# Patient Record
Sex: Female | Born: 1950
Health system: Southern US, Community
[De-identification: ages and names within clinical notes are randomized; demographics above are authoritative.]

## PROBLEM LIST (undated history)

## (undated) DIAGNOSIS — E7800 Pure hypercholesterolemia, unspecified: Secondary | ICD-10-CM

## (undated) DIAGNOSIS — I10 Essential (primary) hypertension: Secondary | ICD-10-CM

## (undated) DIAGNOSIS — K219 Gastro-esophageal reflux disease without esophagitis: Secondary | ICD-10-CM

## (undated) DIAGNOSIS — E119 Type 2 diabetes mellitus without complications: Secondary | ICD-10-CM

## (undated) HISTORY — PX: EYE SURGERY: SHX253

## (undated) HISTORY — PX: CATARACT EXTRACTION W/ INTRAOCULAR LENS  IMPLANT, BILATERAL: SHX1307

## (undated) HISTORY — PX: BACK SURGERY: SHX140

## (undated) HISTORY — PX: ORIF PATELLA: SHX5033

## (undated) HISTORY — PX: PHOTOCOAGULATION WITH LASER: SHX6027

## (undated) HISTORY — PX: CHOLECYSTECTOMY: SHX55

---

## 1973-11-09 HISTORY — PX: TUBAL LIGATION: SHX77

## 1979-07-11 HISTORY — PX: TRIGGER FINGER RELEASE: SHX641

## 1979-07-11 HISTORY — PX: CYST EXCISION: SHX5701

## 1987-11-10 HISTORY — PX: LUMBAR DISC SURGERY: SHX700

## 1998-07-24 ENCOUNTER — Other Ambulatory Visit: Admission: RE | Admit: 1998-07-24 | Discharge: 1998-07-24 | Payer: Self-pay | Admitting: *Deleted

## 1999-07-24 ENCOUNTER — Other Ambulatory Visit: Admission: RE | Admit: 1999-07-24 | Discharge: 1999-07-24 | Payer: Self-pay | Admitting: *Deleted

## 1999-11-10 HISTORY — PX: CARDIAC CATHETERIZATION: SHX172

## 2000-07-27 ENCOUNTER — Other Ambulatory Visit: Admission: RE | Admit: 2000-07-27 | Discharge: 2000-07-27 | Payer: Self-pay | Admitting: *Deleted

## 2001-02-10 ENCOUNTER — Encounter: Admission: RE | Admit: 2001-02-10 | Discharge: 2001-05-11 | Payer: Self-pay | Admitting: Internal Medicine

## 2001-08-10 ENCOUNTER — Other Ambulatory Visit: Admission: RE | Admit: 2001-08-10 | Discharge: 2001-08-10 | Payer: Self-pay | Admitting: *Deleted

## 2002-03-10 ENCOUNTER — Ambulatory Visit (HOSPITAL_COMMUNITY): Admission: RE | Admit: 2002-03-10 | Discharge: 2002-03-10 | Payer: Self-pay | Admitting: Internal Medicine

## 2002-03-13 ENCOUNTER — Encounter: Payer: Self-pay | Admitting: Internal Medicine

## 2002-03-31 ENCOUNTER — Encounter: Payer: Self-pay | Admitting: *Deleted

## 2002-03-31 ENCOUNTER — Ambulatory Visit (HOSPITAL_COMMUNITY): Admission: RE | Admit: 2002-03-31 | Discharge: 2002-03-31 | Payer: Self-pay | Admitting: *Deleted

## 2002-08-14 ENCOUNTER — Other Ambulatory Visit: Admission: RE | Admit: 2002-08-14 | Discharge: 2002-08-14 | Payer: Self-pay | Admitting: *Deleted

## 2003-08-20 ENCOUNTER — Ambulatory Visit (HOSPITAL_COMMUNITY): Admission: RE | Admit: 2003-08-20 | Discharge: 2003-08-20 | Payer: Self-pay | Admitting: Internal Medicine

## 2003-09-11 ENCOUNTER — Other Ambulatory Visit: Admission: RE | Admit: 2003-09-11 | Discharge: 2003-09-11 | Payer: Self-pay | Admitting: Obstetrics and Gynecology

## 2004-03-03 ENCOUNTER — Observation Stay (HOSPITAL_COMMUNITY): Admission: EM | Admit: 2004-03-03 | Discharge: 2004-03-04 | Payer: Self-pay | Admitting: Emergency Medicine

## 2004-10-07 ENCOUNTER — Other Ambulatory Visit: Admission: RE | Admit: 2004-10-07 | Discharge: 2004-10-07 | Payer: Self-pay | Admitting: Obstetrics and Gynecology

## 2005-11-24 ENCOUNTER — Other Ambulatory Visit: Admission: RE | Admit: 2005-11-24 | Discharge: 2005-11-24 | Payer: Self-pay | Admitting: Obstetrics and Gynecology

## 2007-01-04 ENCOUNTER — Encounter: Admission: RE | Admit: 2007-01-04 | Discharge: 2007-01-04 | Payer: Self-pay | Admitting: Obstetrics and Gynecology

## 2007-06-13 ENCOUNTER — Ambulatory Visit (HOSPITAL_COMMUNITY): Admission: RE | Admit: 2007-06-13 | Discharge: 2007-06-13 | Payer: Self-pay | Admitting: Internal Medicine

## 2007-07-08 ENCOUNTER — Ambulatory Visit (HOSPITAL_COMMUNITY): Admission: RE | Admit: 2007-07-08 | Discharge: 2007-07-08 | Payer: Self-pay | Admitting: General Surgery

## 2007-07-29 ENCOUNTER — Ambulatory Visit (HOSPITAL_COMMUNITY): Admission: RE | Admit: 2007-07-29 | Discharge: 2007-07-29 | Payer: Self-pay | Admitting: General Surgery

## 2007-07-29 ENCOUNTER — Encounter (INDEPENDENT_AMBULATORY_CARE_PROVIDER_SITE_OTHER): Payer: Self-pay | Admitting: General Surgery

## 2007-12-23 ENCOUNTER — Encounter: Admission: RE | Admit: 2007-12-23 | Discharge: 2007-12-23 | Payer: Self-pay | Admitting: Obstetrics and Gynecology

## 2009-06-06 ENCOUNTER — Ambulatory Visit (HOSPITAL_COMMUNITY): Admission: RE | Admit: 2009-06-06 | Discharge: 2009-06-06 | Payer: Self-pay | Admitting: Internal Medicine

## 2010-10-27 ENCOUNTER — Ambulatory Visit (HOSPITAL_COMMUNITY)
Admission: RE | Admit: 2010-10-27 | Discharge: 2010-10-27 | Payer: Self-pay | Source: Home / Self Care | Attending: Internal Medicine | Admitting: Internal Medicine

## 2010-11-30 ENCOUNTER — Encounter: Payer: Self-pay | Admitting: Obstetrics and Gynecology

## 2011-03-24 NOTE — Op Note (Signed)
NAME:  Gabriella Holmes, SCINTO               ACCOUNT NO.:  [id number]   MEDICAL RECORD NO.:  [id number]          PATIENT TYPE:  AMB   LOCATION:  DAY                           FACILITY:  [facility]   PHYSICIAN:  Dalia Heading, M.D.  DATE OF BIRTH:  June 04, 1951   DATE OF PROCEDURE:  DATE OF DISCHARGE:                               OPERATIVE REPORT   PREOPERATIVE DIAGNOSES:  1. Cholecystitis.  2. Cholelithiasis.   POSTOPERATIVE DIAGNOSES:  1. Cholecystitis.  2. Cholelithiasis.   PROCEDURE:  Laparoscopic cholecystectomy.   SURGEON:  Dalia Heading, MD   ASSISTANT:  Tilford Pillar, MD   ANESTHESIA:  General endotracheal.   INDICATIONS:  The patient is a 60 year old white female with a history  of gallstone pancreatitis, who now presents for laparoscopic  cholecystectomy with possible cholangiograms.  The risks and benefits of  the procedure including bleeding, infection, hepatobiliary injury, and  the possibility an open procedure, were fully explained to the patient,  who gave informed consent.   PROCEDURE NOTE:  The patient was placed in the supine position.  After  induction of general endotracheal anesthesia, the abdomen was prepped  and draped using the usual sterile technique with Betadine.  Surgical  site confirmation was performed.   A supraumbilical incision was made down to the fascia.  A Veress needle  was introduced into the abdominal cavity and confirmation of placement  was done using the saline drop test.  The abdomen was then insufflated  to 16 mmHg pressure.  An 11-mm trocar was introduced into the abdominal  cavity under direct visualization without difficulty.  The patient was  placed in reverse Trendelenburg position.  An additional 11-mm trocar  was placed in the epigastric region and 5-mm trocars were placed in the  right upper quadrant and right flank regions.  The liver was inspected  and noted to be within normal limits.  The gallbladder was retracted  superiorly  and laterally.  The dissection was begun around the  infundibulum of the gallbladder.  The cystic duct was first identified.  Its juncture to the infundibulum fully identified.  Several attempts  were made to pass a catheter into the cystic duct.  The cystic duct was  somewhat shortened and small.  Despite multiple attempts, a  cholangiogram could not be performed.  Endoclips were placed proximally  distally and distally on the cystic duct and the cystic duct was  divided.  This was likewise done with the cystic artery.  The  gallbladder then freed away from the gallbladder fossa using Bovie  electrocautery.  The gallbladder was delivered through the epigastric  trocar site using an EndoCatch bag.  Gallbladder fossa was inspected and  no abnormal bleeding or bile leakage was noted.  Surgicel was placed in  the gallbladder fossa.  All fluid and air were then evacuated from the  abdominal cavity prior to removal of the trocars.   All wounds were irrigated with normal saline.  All wounds were checked  with 0.5% Sensorcaine.  The supraumbilical fascia was closed using a 0  Vicryl interrupted suture.  All skin incisions  were closed using  staples.  Betadine ointment and dry sterile dressings were applied.   All tape and needle counts were correct at the end of the procedure.  The patient was extubated in the operating room and went back to the  recovery room awake, in stable condition.   COMPLICATIONS:  None.   SPECIMEN:  Gallbladder.   BLOOD LOSS:  Minimal.      Dalia Heading, M.D.  Electronically Signed     MAJ/MEDQ  D:  07/29/2007  T:  07/29/2007  Job:  [id number]   cc:   Kingsley Callander. Ouida Sills, MD  Fax: [phone]

## 2011-03-24 NOTE — H&P (Signed)
[name], Gabriella Holmes               ACCOUNT NO.:  [id number]   MEDICAL RECORD NO.:  [id number]          PATIENT TYPE:  AMB   LOCATION:  DAY                           FACILITY:  [facility]   PHYSICIAN:  Dalia Heading, M.D.  DATE OF BIRTH:  February 01, 1951   DATE OF ADMISSION:  07/19/2007  DATE OF DISCHARGE:  LH                              HISTORY & PHYSICAL   CHIEF COMPLAINT:  Gallstone pancreatitis.   HISTORY OF PRESENT ILLNESS:  The patient is a 60 year old white female  who is referred for evaluation and treatment of a history of  pancreatitis.  This was found on workup for abdominal pain and confirmed  by CT scan of the abdomen.  Ultrasound of the gallbladder reveals  cholelithiasis with a common bile duct approximately 7 mm in size.  Her  pain occurred several months ago, but has since resolved.  No jaundice  had been noted.  No recent fevers, pain, or chills have been noted.   PAST MEDICAL HISTORY:  1. Insulin-dependent diabetes mellitus.  2. Hypertension.   PAST SURGICAL HISTORY:  Back surgery, neck surgery and hand surgery.   CURRENT MEDICATIONS:  1. NovoLog insulin 16 units q.a.m., 20 units q.p.m.  2. Lantus insulin 60 units subcutaneously q.a.m. and 60 units q.h.s.  3. Benicar 40 mg p.o. daily.  4. Metformin 1000 mg p.o. daily.  5. Baby aspirin which she is holding.  6. Prilosec p.r.n.  7. Lipitor 20 mg p.o. daily.   ALLERGIES:  No known drug allergies.   REVIEW OF SYSTEMS:  The patient denies drinking or smoking.  She denies  any other cardiopulmonary difficulties or bleeding disorders.   PHYSICAL EXAMINATION:  The patient is an obese white female in no acute  distress.  HEENT: Examination reveals no scleral icterus.  LUNGS:  Clear to auscultation with equal breath sounds bilaterally.  HEART:  Examination reveals a regular rate and rhythm without S3, S4 or  murmurs.  ABDOMEN:  Soft, nontender, nondistended.  No hepatosplenomegaly, masses,  hernias are identified.   IMPRESSION:  Gallstone pancreatitis.   PLAN:  The patient is scheduled for a laparoscopic cholecystectomy with  cholangiogram on July 29, 2007.  The risks and benefits of the  procedure, including bleeding, infection, hepatobiliary injury, and the  possibility of a postoperative ERCP were fully explained to the patient,  who gave informed consent.      Dalia Heading, M.D.  Electronically Signed     MAJ/MEDQ  D:  07/19/2007  T:  07/19/2007  Job:  [id number]   cc:   Kingsley Callander. Ouida Sills, MD  Fax: [phone]

## 2011-03-27 NOTE — H&P (Signed)
NAME:  Gabriella Holmes, Gabriella Holmes                         ACCOUNT NO.:  [id number]   MEDICAL RECORD NO.:  [id number]                   PATIENT TYPE:  INP   LOCATION:  IC04                                 FACILITY:  [facility]   PHYSICIAN:  Kingsley Callander. Ouida Sills, M.D.                  DATE OF BIRTH:  Dec 29, 1950   DATE OF ADMISSION:  03/03/2004  DATE OF DISCHARGE:  03/04/2004                                HISTORY & PHYSICAL   CHIEF COMPLAINT:  Passed out.   HISTORY OF PRESENT ILLNESS:  This patient is a 60 year old white female who  was sitting at home when she began feeling very weak as though she might  pass out.  She stood up to the go to the bathroom and did pass out.  She has  subsequently experienced nausea and vomiting.  On presentation to the  emergency room she was found to be bradycardic with a heart rate of 40 and  borderline hypotensive with a blood pressure of 90/56.  She had experienced  diaphoresis, but no chest pain.  She has a history of a cardiac  catheterization in May 2003 which revealed no significant coronary disease  and normal LV function.   PAST MEDICAL HISTORY:  1. Diabetes.  2. Hypertension.  3. Obesity.  4. Hyperlipidemia.  5. Back surgery in 1991.  6. GERD.   MEDICATIONS:  1. Tenoretic 20/25 daily.  2. Verapamil SR 480 mg daily.  3. Benicar 10 mg daily.  4. Lipitor 20 mg daily.  5. Prilosec 20 mg daily.  6. Metformin 1000 mg b.i.d.  7. Humulin N 40 units q.a.m., 60 units q.p.m.  8. Humalog 20 units at breakfast, 10 units at lunch, 20 units at supper.  9. Aspirin 81 mg daily.   ALLERGIES:  None.   SOCIAL HISTORY:  She does not smoke cigarettes.  She really has a drink of  alcohol.  She lives with her husband in [place].   FAMILY HISTORY:  Her father died of an MI at 46.  Her mother has had an MI.  A sister has had diabetes and hypertension.   REVIEW OF SYSTEMS:  No headache, fever, chest pain, arm pain or hematemesis.  No slurred speech, unilateral weakness,  incontinence or tongue biting.   PHYSICAL EXAMINATION:  VITAL SIGNS:  Temperature 96.5, blood pressure  initially 90/56, pulse 42, respirations 20.  Oxygen saturation 98% on room  air.  GENERAL:  Fully oriented in no distress.  HEENT:  Eyes and oropharynx unremarkable.  NECK:  Reveals no JVD or thyromegaly.  LUNGS:  Clear.  HEART:  Bradycardic with no murmurs.  ABDOMEN:  Soft, nontender, no hepatosplenomegaly.  EXTREMITIES:  Normal pulses, no clubbing or edema.  NEUROLOGIC:  Normal.   LABORATORY DATA:  White count 11.6, hemoglobin 12.5 platelets 186.  Sodium  138, potassium 4.8, bicarb 28, glucose 198, BUN 21, creatinine 1.3, calcium  9.4.  CK 113 with an MB for 4.3, troponin I0.01.  The EKG reveals sinus  bradycardia with no ischemic changes.  A CT scan of the head is normal.   IMPRESSION AND PLAN:  1. Syncope.  She was not hypoglycemic at the time of this event.  She has     been bradycardiac and borderline hypotensive.  Will stop Tenoretic.  Will     hold verapamil and  Benicar for now. She may have simply had a vasovagal     episode.  She has previously tolerated the beta blocker and rate limiting     calcium blocker together without this degree of bradycardia.  After     observation in the emergency room, though her heart rate improved into     the 60s and 70s. She had no sign of heart block.  Will observe in the     hospital over night and repeat set of cardiac enzymes.  The minimal     elevation of her CK/MB is not likely significant.  Will repeat her     cardiogram in the morning.  2. Diabetes.  Continue insulin and metformin.  3. Hypertension.  4. Hyperlipidemia.     ___________________________________________                                         Kingsley Callander. Ouida Sills, M.D.   ROF/MEDQ  D:  03/04/2004  T:  03/04/2004  Job:  [id number]

## 2011-03-27 NOTE — Procedures (Signed)
[facility]  Patient:    Gabriella Holmes, Gabriella Holmes Visit Number: [id number] MRN: [id number]          Service Type: OUT Location: RAD Attending Physician:  Carylon Perches Dictated by:   Carylon Perches, M.D. Proc. Date: 03/10/02 Admit Date:  03/10/2002 Discharge Date: 03/10/2002                                Stress Test  PROBLEM:  Cardiolite stress test.  PHYSICIAN:  Carylon Perches, M.D.  DESCRIPTION OF PROCEDURE:  Ms. Tapia exercised for 5 minutes and 42 seconds (2 minutes and 42 seconds into stage 2 of the Bruce protocol), attaining a maximal heart heart of 142 (84% of the age-predicted maximal heart rate), at a work load of 7 METS, and discontinued exercise due to fatigue.  There were no symptoms of chest pain.  There were infrequent ventricular premature complexes.  There were no ST segment changes diagnostic of ischemia.  The baseline electrocardiogram revealed normal sinus rhythm at 64 beats per minute.  IMPRESSION:  No evidence of exercise-induced ischemia.  Cardiolite images are pending. Dictated by:   Carylon Perches, M.D. Attending Physician:  Carylon Perches DD:  03/10/02 TD:  03/12/02 Job: [id number]

## 2011-03-27 NOTE — Op Note (Signed)
NAME:  Gabriella Holmes, Gabriella Holmes                         ACCOUNT NO.:  [id number]   MEDICAL RECORD NO.:  [id number]                   PATIENT TYPE:  AMB   LOCATION:  DAY                                  FACILITY:  [facility]   PHYSICIAN:  R. Roetta Sessions, M.D.              DATE OF BIRTH:  Jan 20, 1951   DATE OF PROCEDURE:  08/20/2003  DATE OF DISCHARGE:                                 OPERATIVE REPORT   PROCEDURE PERFORMED:  Colonoscopy, snare polypectomy.   INDICATIONS FOR PROCEDURE:  The patient is a 61 year old lady kindly sent  over through the courtesy of Dr. Carylon Perches for colorectal cancer screening.  She is devoid of any lower GI tract symptoms.  There is no family history of  colorectal neoplasia.  She has never had her lower GI tract imaged  previously.  Colonoscopy is now being done.  This approach has been  discussed with the patient previously and again today at bedside.  Potential  risks, benefits, and alternatives have been reviewed and questions answered.  Please see my handwritten H&P for more information.   PROCEDURE NOTE:  O2 saturation, blood pressure, pulse and respirations were  monitored throughout the entire procedure.  Conscious sedation of Versed 3  mg IV, Demerol 75 mg IV in divided doses.   INSTRUMENT:  Olympus video chip adult colonoscope.   FINDINGS:  Digital rectal exam revealed no abnormalities.   ENDOSCOPIC FINDINGS:  The prep was adequate.   Rectum:  Examination of the rectal mucosa including retroflexed view of the  anal verge revealed no abnormalities.   Colon:  The colonic mucosa was surveyed from the rectosigmoid junction  through the left, transverse, right colon to the area of the appendiceal  orifice, ileocecal valve, and the cecum.  These structures were well seen  and photographed for the record.  No colonic mucosa abnormalities were seen.  Upon advancing the scope in the cecum from the level of the cecum and  ileocecal valve, the scope was slowly  withdrawn.  All previously mentioned  mucosal surfaces were again seen and at 25 cm, there was a 0.75 cm  pedunculated polyp.  Please see photos.  It was resectable with snare  cautery and recovered through the scope.  The patient tolerated the  procedure well and was reactive at endoscopy.   IMPRESSION:  1. Normal rectum.  2. Pedunculated polyp at 25 cm, (sigmoid), as described above removed with     the snare.  The remainder of the colonic mucosa appeared normal.    RECOMMENDATIONS:  1. No aspirin or arthritis medications for the next 10 days.  2. Follow up on pathology.  3. Further recommendations to follow.      ___________________________________________  Jonathon Bellows, M.D.   RMR/MEDQ  D:  08/20/2003  T:  08/20/2003  Job:  [id number]   cc:   Kingsley Callander. Ouida Sills, M.D.  [address]  [place]  [place] [address]  Fax: [phone]

## 2011-03-27 NOTE — Discharge Summary (Signed)
NAME:  Gabriella Holmes, Gabriella Holmes                         ACCOUNT NO.:  [id number]   MEDICAL RECORD NO.:  [id number]                   PATIENT TYPE:  INP   LOCATION:  IC04                                 FACILITY:  [facility]   PHYSICIAN:  Kingsley Callander. Ouida Sills, M.D.                  DATE OF BIRTH:  June 30, 1951   DATE OF ADMISSION:  03/03/2004  DATE OF DISCHARGE:  03/04/2004                                 DISCHARGE SUMMARY   DISCHARGE DIAGNOSES:  1. Syncope.  2. Bradycardia.  3. Borderline hypertension.  4. History of hypertension.  5. Diabetes.  6. Hyperlipidemia.   HOSPITAL COURSE:  This patient is a 59 year old white female who was  hospitalized for observation after she presented with a syncopal episode  with a heart rate in the 40s and a blood pressure in the 90/50 range.  After  observation, her blood pressure and heart rate normalized. Verapamil,  atenolol, chlorthalidone, and Benicar were held. She had no signs of heart  block. Cardiac enzymes were normal. EKGs were normal. A CT scan of the head  was normal. She was observed overnight in a monitored setting and had no  arrhythmias. She was felt to be stable for discharge on the morning of March 04, 2004. Atenolol and chlorthalidone will be stopped. She will be follow up  in the office in one week.   DISCHARGE MEDICATIONS:  1. Aspirin 81 mg daily.  2. Benicar 20 mg daily.  3. Verapamil SR 240 mg two daily.  4. Lipitor 20 mg daily.  5. Prilosec 20 mg daily.  6. Humulin N 40 units q.a.m. and 60 units q.p.m.  7. Humalog 20 units at breakfast, 10 units at lunch, 20 units at supper.  8. Metformin 1000 mg b.i.d.     ___________________________________________                                         Kingsley Callander. Ouida Sills, M.D.   ROF/MEDQ  D:  03/04/2004  T:  03/04/2004  Job:  [id number]

## 2011-03-27 NOTE — Cardiovascular Report (Signed)
[place]  Patient:    Gabriella Holmes, Gabriella Holmes Visit Number: [id number] MRN: [id number]          Service Type: CAT Location: [id number] Attending Physician:  Daisey Must Dictated by:   Daisey Must, M.D. [facility] Proc. Date: 03/31/02 Admit Date:  03/31/2002 Discharge Date: 03/31/2002   CC:         Dietrich Pates, M.D. [facility]  Carylon Perches, M.D.  Cardiac Catheterization Laboratory   Cardiac Catheterization  PROCEDURES PERFORMED: Left heart catheterization with coronary angiography and left ventriculography.  INDICATIONS: The patient is a 60 year old woman with multiple cardiac risk factors. She has been having progressive exertional dyspnea. A stress Cardiolite was interpreted as revealing anterior septal ischemia.  DESCRIPTION OF PROCEDURE: A 6 French sheath was placed in the right femoral artery.  Standard Judkins 6 French catheters were utilized.  Contrast was Omnipaque.  There were no complications.  RESULTS:  HEMODYNAMICS: Left ventricular pressure 144/27.  Aortic pressure 144/72. There was no aortic valve gradient.  LEFT VENTRICULOGRAM: Wall motion is normal. Ejection fraction calculated at 68%. There is trace mitral regurgitation.  CORONARY ARTERIOGRAPHY: (Codominant).  Left main is normal.  Left anterior descending artery has minor luminal irregularities in the proximal and mid vessel. The LAD gives rise to a normal sized first diagonal which has a 20% stenosis at its origin. There is a small second diagonal.  Left circumflex is a codominant vessel. It gives rise to a normal sized OM-1, a large OM-2 and three small posterolateral branches.  The left circumflex is normal.  Right coronary artery is a codominant vessel. There is a 20% stenosis in the proximal vessel. The distal right coronary artery gives rise to a large acute marginal which supplies a portion of the inferior septum. There is a small posterior descending artery  arising from the distal right coronary artery.  IMPRESSIONS: 1. Normal left ventricular systolic function. 2. No significant coronary artery disease identified. Dictated by:   Daisey Must, M.D. [facility] Attending Physician:  Daisey Must DD:  03/31/02 TD:  04/03/02 Job: [id number]

## 2011-08-06 DIAGNOSIS — H2513 Age-related nuclear cataract, bilateral: Secondary | ICD-10-CM | POA: Insufficient documentation

## 2011-08-06 DIAGNOSIS — E113593 Type 2 diabetes mellitus with proliferative diabetic retinopathy without macular edema, bilateral: Secondary | ICD-10-CM | POA: Insufficient documentation

## 2011-08-20 LAB — BASIC METABOLIC PANEL
BUN: 20
CO2: 30
Calcium: 9.3
Chloride: 102
Creatinine, Ser: 0.71
GFR calc Af Amer: >60
GFR calc non Af Amer: >60
Glucose, Bld: 177 — ABNORMAL HIGH
Potassium: 4.1
Sodium: 138

## 2011-08-20 LAB — HEPATIC FUNCTION PANEL
ALT: 18
AST: 16
Albumin: 3.4 — ABNORMAL LOW
Alkaline Phosphatase: 59
Bilirubin, Direct: 0.1
Indirect Bilirubin: 0.6
Total Bilirubin: 0.7
Total Protein: 6.1

## 2011-08-20 LAB — LIPASE, BLOOD: Lipase: 20

## 2011-08-20 LAB — CBC
HCT: 36
Hemoglobin: 12.4
MCHC: 34.3
MCV: 86.6
Platelets: 199
RBC: 4.16
RDW: 13.7
WBC: 7

## 2011-08-20 LAB — AMYLASE: Amylase: 45

## 2012-03-04 ENCOUNTER — Other Ambulatory Visit (HOSPITAL_COMMUNITY): Payer: Self-pay | Admitting: Internal Medicine

## 2012-03-04 ENCOUNTER — Ambulatory Visit (HOSPITAL_COMMUNITY)
Admission: RE | Admit: 2012-03-04 | Discharge: 2012-03-04 | Disposition: A | Discharge status: Home / Self Care | Payer: BC Managed Care – PPO | Source: Ambulatory Visit | Attending: Internal Medicine | Admitting: Internal Medicine

## 2012-03-04 DIAGNOSIS — R0789 Other chest pain: Secondary | ICD-10-CM

## 2012-03-04 DIAGNOSIS — R071 Chest pain on breathing: Secondary | ICD-10-CM | POA: Insufficient documentation

## 2012-06-27 DIAGNOSIS — H43819 Vitreous degeneration, unspecified eye: Secondary | ICD-10-CM | POA: Insufficient documentation

## 2012-06-27 DIAGNOSIS — Z961 Presence of intraocular lens: Secondary | ICD-10-CM | POA: Insufficient documentation

## 2012-09-27 ENCOUNTER — Other Ambulatory Visit: Payer: Self-pay | Admitting: Obstetrics and Gynecology

## 2013-03-02 DIAGNOSIS — H4311 Vitreous hemorrhage, right eye: Secondary | ICD-10-CM | POA: Insufficient documentation

## 2013-07-04 ENCOUNTER — Telehealth: Payer: Self-pay | Admitting: *Deleted

## 2013-07-04 NOTE — Telephone Encounter (Signed)
Pt called to schedule TCS, pt received a letter in the mail. Please advise [phone]

## 2013-07-04 NOTE — Telephone Encounter (Signed)
I called pt and she is not having any problems now. She really wants to have the colonoscopy in November 2014. ( she has a big wedding coming up of her sons in Nov. She is thinking the 3rd week. I will keep her on my recall and try to call in mid Oct. If I do not hear from her.   Her last one was 08/20/2003 by RMR.

## 2013-08-01 ENCOUNTER — Ambulatory Visit: Payer: BC Managed Care – PPO | Admitting: Gastroenterology

## 2013-09-02 ENCOUNTER — Emergency Department (HOSPITAL_COMMUNITY)
Admission: EM | Admit: 2013-09-02 | Discharge: 2013-09-02 | Disposition: A | Discharge status: Home / Self Care | Payer: BC Managed Care – PPO | Attending: Emergency Medicine | Admitting: Emergency Medicine

## 2013-09-02 ENCOUNTER — Emergency Department (HOSPITAL_COMMUNITY): Payer: BC Managed Care – PPO

## 2013-09-02 ENCOUNTER — Encounter (HOSPITAL_COMMUNITY): Payer: Self-pay | Admitting: Emergency Medicine

## 2013-09-02 DIAGNOSIS — E119 Type 2 diabetes mellitus without complications: Secondary | ICD-10-CM | POA: Insufficient documentation

## 2013-09-02 DIAGNOSIS — Z794 Long term (current) use of insulin: Secondary | ICD-10-CM | POA: Insufficient documentation

## 2013-09-02 DIAGNOSIS — K219 Gastro-esophageal reflux disease without esophagitis: Secondary | ICD-10-CM | POA: Insufficient documentation

## 2013-09-02 DIAGNOSIS — I10 Essential (primary) hypertension: Secondary | ICD-10-CM | POA: Insufficient documentation

## 2013-09-02 DIAGNOSIS — R296 Repeated falls: Secondary | ICD-10-CM | POA: Insufficient documentation

## 2013-09-02 DIAGNOSIS — S42209A Unspecified fracture of upper end of unspecified humerus, initial encounter for closed fracture: Secondary | ICD-10-CM | POA: Insufficient documentation

## 2013-09-02 DIAGNOSIS — Z7982 Long term (current) use of aspirin: Secondary | ICD-10-CM | POA: Insufficient documentation

## 2013-09-02 DIAGNOSIS — Z79899 Other long term (current) drug therapy: Secondary | ICD-10-CM | POA: Insufficient documentation

## 2013-09-02 DIAGNOSIS — S42201A Unspecified fracture of upper end of right humerus, initial encounter for closed fracture: Secondary | ICD-10-CM

## 2013-09-02 DIAGNOSIS — Y9301 Activity, walking, marching and hiking: Secondary | ICD-10-CM | POA: Insufficient documentation

## 2013-09-02 DIAGNOSIS — Y9229 Other specified public building as the place of occurrence of the external cause: Secondary | ICD-10-CM | POA: Insufficient documentation

## 2013-09-02 DIAGNOSIS — E7800 Pure hypercholesterolemia, unspecified: Secondary | ICD-10-CM | POA: Insufficient documentation

## 2013-09-02 HISTORY — DX: Essential (primary) hypertension: I10

## 2013-09-02 HISTORY — DX: Gastro-esophageal reflux disease without esophagitis: K21.9

## 2013-09-02 HISTORY — DX: Pure hypercholesterolemia, unspecified: E78.00

## 2013-09-02 MED ORDER — HYDROCODONE-ACETAMINOPHEN 5-325 MG PO TABS: 2.0000 | ORAL_TABLET | ORAL | Status: DC | PRN

## 2013-09-02 MED ORDER — ONDANSETRON HCL 4 MG PO TABS: 4.0000 mg | ORAL_TABLET | Freq: Four times a day (QID) | ORAL | Status: DC

## 2013-09-02 MED ORDER — HYDROCODONE-ACETAMINOPHEN 5-325 MG PO TABS
2.0000 | ORAL_TABLET | Freq: Once | ORAL | Status: AC
  Administered 2013-09-02: 2 via ORAL
  Filled 2013-09-02: qty 2

## 2013-09-02 NOTE — ED Notes (Addendum)
[place] causing her to fall landing on the cement, pt states that she did hit her face against the cement and injured her right upper arm, cms intact distal. Pt states that she did get "light headed" after the fall but became better after she went to lay down.

## 2013-09-02 NOTE — Discharge Instructions (Signed)
Extremity Fracture Take the pain medication as prescribed. Followup with Dr. Romeo Apple at 9 am Monday. Return to the ED if you develop worsening pain, weakness, numbness, tingling, color change, difficulty moving fingers or any other concerns. Broken bones (fractures) take several weeks to months to heal depending on the bone involved. The broken ends must be lined up correctly and kept in position for proper healing. Do not remove the splint, immobilizer, or cast that has been applied to treat your injury. This is the most important part of your treatment. Other measures to treat fractures include:  Keeping the injured limb at rest and elevated above your heart as recommended by your caregiver. This will help reduce pain and swelling.  Ice packs can be applied to your fracture site for 20-30 minutes every 3-4 hours over the next 2-3 days.  Pain medicine may be prescribed in the first days after a fracture. SEEK IMMEDIATE MEDICAL CARE IF:  You develop increasing pain or pressure in the injured limb, or if it becomes cold, numb, or pale.  There is increasing pain with motion of your fingers or toes. Document Released: 12/03/2004 Document Revised: 01/18/2012 Document Reviewed: 02/13/2009 [facility] Patient Information 2014 [place], [place].

## 2013-09-02 NOTE — ED Provider Notes (Signed)
CSN: [id number]     Arrival date & time 09/02/13  1619 History   First MD Initiated Contact with Patient 09/02/13 1628    Scribed for No att. providers found, the patient was seen in room APOTF/OTF. This chart was scribed by Lewanda Rife, ED scribe. Patient's care was started at 6:36 PM  Chief Complaint  Patient presents with  . Arm Pain   (Consider location/radiation/quality/duration/timing/severity/associated sxs/prior Treatment) The history is provided by the patient. No language interpreter was used.   HPI Comments: EPSIE WALTHALL is a 62 y.o. female who presents to the Emergency Department with PMHx of HTN, DM, and HLD complaining of constant moderate upper right arm pain onset 12:30 pm after a mechanical fall at church. Pt reports she missed a step causing her to land on her face and upper right arm on a carpeted floor. Reports pain is exacerbated with movement and alleviated by nothing. Denies associated headache, neck pain, LOC, facial pain, chest pain, abdominal pain, other trauma, dizziness, and light-headedness. Reports taking 800 mg of ibuprofen at 1 pm today. Denies anticoagulation therapy at this time.    Past Medical History  Diagnosis Date  . Diabetes mellitus without complication   . Hypertension   . GERD (gastroesophageal reflux disease)   . High cholesterol    Past Surgical History  Procedure Laterality Date  . Cholecystectomy    . Back surgery    . Tubal ligation    . Trigger finger release    . Cyst excision      from facial area.    No family history on file. History  Substance Use Topics  . Smoking status: Never Smoker   . Smokeless tobacco: Not on file  . Alcohol Use: Not on file   OB History   Grav Para Term Preterm Abortions TAB SAB Ect Mult Living                 Review of Systems  Musculoskeletal: Positive for myalgias.  All other systems reviewed and are negative.   A complete 10 system review of systems was obtained and all systems  are negative except as noted in the HPI and PMHx.    Allergies  Review of patient's allergies indicates no known allergies.  Home Medications   Current Outpatient Rx  Name  Route  Sig  Dispense  Refill  . aspirin EC 81 MG tablet   Oral   Take 81 mg by mouth daily.         Marland Kitchen atorvastatin (LIPITOR) 80 MG tablet   Oral   Take 80 mg by mouth at bedtime.         . Calcium Carbonate-Vitamin D (CALTRATE 600+D PO)   Oral   Take 1 tablet by mouth 2 (two) times daily.         . Canagliflozin 300 MG TABS   Oral   Take 300 mg by mouth daily.         . chlorthalidone (HYGROTON) 25 MG tablet   Oral   Take 25 mg by mouth every other day.         . cholecalciferol (VITAMIN D) 1000 UNITS tablet   Oral   Take 1,000 Units by mouth at bedtime.         . cyanocobalamin (,VITAMIN B-12,) 1000 MCG/ML injection   Intramuscular   Inject 1,000 mcg into the muscle every 30 (thirty) days.         . Exenatide (BYDUREON )  Subcutaneous   Inject 1 application into the skin once a week. Takes on Sunday         . ibuprofen (ADVIL,MOTRIN) 200 MG tablet   Oral   Take 800 mg by mouth every 6 (six) hours as needed for pain.         Marland Kitchen insulin glargine (LANTUS) 100 UNIT/ML injection   Subcutaneous   Inject 30 Units into the skin 2 (two) times daily.         Marland Kitchen losartan (COZAAR) 100 MG tablet   Oral   Take 100 mg by mouth daily.         . metFORMIN (GLUCOPHAGE) 1000 MG tablet   Oral   Take 1,000 mg by mouth 2 (two) times daily with a meal.         . pioglitazone (ACTOS) 30 MG tablet   Oral   Take 15 mg by mouth daily.         Marland Kitchen HYDROcodone-acetaminophen (NORCO/VICODIN) 5-325 MG per tablet   Oral   Take 2 tablets by mouth every 4 (four) hours as needed for pain.   20 tablet   0   . ondansetron (ZOFRAN) 4 MG tablet   Oral   Take 1 tablet (4 mg total) by mouth every 6 (six) hours.   12 tablet   0    BP 140/67  Pulse 96  Temp(Src) 97.8 F (36.6 C) (Oral)   Resp 17  SpO2 97% Physical Exam  Nursing note and vitals reviewed. Constitutional: She is oriented to person, place, and time. She appears well-developed and well-nourished. No distress.  HENT:  Head: Normocephalic and atraumatic.  Eyes: EOM are normal.  Neck: Neck supple. No tracheal deviation present.  Cardiovascular: Normal rate, regular rhythm, intact distal pulses and normal pulses.   No murmur heard. Pulses:      Radial pulses are 2+ on the right side, and 2+ on the left side.  Pulmonary/Chest: Effort normal and breath sounds normal. No respiratory distress.  Abdominal: Soft. There is no tenderness.  Musculoskeletal: Normal range of motion. She exhibits tenderness.  No pain on right hand and right shoulder   No C-spine, T-spine, or L-spine tenderness Reduced ROM of right shoulder due to pain. TTP over right humerus with no deformity. Cardinal hand movements intact. Full ROM of right elbow without pain   Neurological: She is alert and oriented to person, place, and time. No cranial nerve deficit or sensory deficit.  Skin: Skin is warm and dry.  Psychiatric: She has a normal mood and affect. Her behavior is normal.    ED Course  Procedures (including critical care time) COORDINATION OF CARE:  Nursing notes reviewed. Vital signs reviewed. Initial pt interview and examination performed.   6:36 PM-Discussed work up plan with pt at bedside, which includes EKG, x-ray of right humerus, x-ray of right shoulder, and head CT. Pt agrees with plan.  Treatment plan initiated: Medications  HYDROcodone-acetaminophen (NORCO/VICODIN) 5-325 MG per tablet 2 tablet (2 tablets Oral Given 09/02/13 1645)   6:36 PM consulted with Dr. Romeo Apple about pt's humerus fx. He requests f/u appointment 9 am 08/25/13 in office .   5:50 PM Nursing Notes Reviewed/ Care Coordinated Applicable Imaging Reviewed and incorporated into ED treatment Discussed results and treatment plan with pt. Pt demonstrates  understanding and agrees with plan to f/u with Dr. Romeo Apple on Monday morning. Pt informed of return precautions and is comfortable with discharge at this time.   Initial diagnostic testing ordered.  Labs Review Labs Reviewed - No data to display Imaging Review Dg Shoulder Right  09/02/2013   CLINICAL DATA:  Fall, pain  EXAM: RIGHT SHOULDER - 2+ VIEW  COMPARISON:  None.  FINDINGS: There is a comminuted fracture involving the surgical neck of the humerus. There are 3 primary fracture fragments involving humeral head, greater tuberosity, and humeral shaft. There is mild angulation at the fracture site. There is moderate displacement of the greater tuberosity fracture fragment.  IMPRESSION: Comminuted fracture of the proximal humerus.   Electronically Signed   By: Esperanza Heir M.D.   On: 09/02/2013 17:33   Ct Head Wo Contrast  09/02/2013   CLINICAL DATA:  Trauma to the front of face and head.  EXAM: CT HEAD WITHOUT CONTRAST  TECHNIQUE: Contiguous axial images were obtained from the base of the skull through the vertex without intravenous contrast.  COMPARISON:  CT head without contrast 03/03/2004  FINDINGS: Fullness at the foramen magnum suggests the possibility of a Chiari malformation. No acute cortical infarct, hemorrhage, or mass lesion is present. The ventricles are of normal size. No significant extra-axial fluid collection is present. The paranasal sinuses and mastoid air cells are clear. No significant extracranial soft tissue injury is evident.  IMPRESSION: 1. Probable Chiari 1 malformation. 2. No acute intracranial abnormality. 3. No acute osseous injury.   Electronically Signed   By: Gennette Pac M.D.   On: 09/02/2013 17:29   Dg Humerus Right  09/02/2013   CLINICAL DATA:  Fall, pain  EXAM: RIGHT HUMERUS - 2+ VIEW  COMPARISON:  None.  FINDINGS: There is a comminuted fracture through the surgical neck of the humerus with 3 major fracture fragments, 1 of which including the greater  tuberosity of the humerus. There is mild displacement and angulation involving the primary fracture site. Greater tuberosity is moderately displaced.  IMPRESSION: Proximal humeral fracture.   Electronically Signed   By: Esperanza Heir M.D.   On: 09/02/2013 17:32    EKG Interpretation     Ventricular Rate:  85 PR Interval:  186 QRS Duration: 74 QT Interval:  378 QTC Calculation: 449 R Axis:   2 Text Interpretation:  Normal sinus rhythm Low voltage QRS Borderline ECG When compared with ECG of 27-Jul-2007 09:08, No significant change was found No significant change was found            MDM   1. Proximal humerus fracture, right, closed, initial encounter    Mechanical trip and fall with right shoulder pain. States she may have hit her head but did not lose consciousness. No anticoagulant use. No weakness, numbness or tingling.  Imaging remarkable for comminuted right proximal humerus fracture. +2 radial pulse, cardinal hand movements intact, axillary nerve sensation intact. She denies pain at any other location than her shoulder.  Discussed with Dr. Romeo Apple. He wants to see patient at 67 AM on October 28. Will take place the patient in sling. She'll be instructed to return with worsening pain, color change, paresthesias, weakness or any other concerns/  I personally performed the services described in this documentation, which was scribed in my presence. The recorded information has been reviewed and is accurate.    Glynn Octave, MD 09/02/13 [phone]

## 2013-09-04 ENCOUNTER — Encounter: Payer: Self-pay | Admitting: Orthopedic Surgery

## 2013-09-04 ENCOUNTER — Ambulatory Visit (INDEPENDENT_AMBULATORY_CARE_PROVIDER_SITE_OTHER): Payer: BC Managed Care – PPO | Admitting: Orthopedic Surgery

## 2013-09-04 ENCOUNTER — Telehealth: Payer: Self-pay | Admitting: Radiology

## 2013-09-04 VITALS — BP 114/54 | Ht 68.0 in | Wt 245.0 lb

## 2013-09-04 DIAGNOSIS — S42301A Unspecified fracture of shaft of humerus, right arm, initial encounter for closed fracture: Secondary | ICD-10-CM

## 2013-09-04 DIAGNOSIS — S42309A Unspecified fracture of shaft of humerus, unspecified arm, initial encounter for closed fracture: Secondary | ICD-10-CM | POA: Insufficient documentation

## 2013-09-04 MED ORDER — HYDROCODONE-ACETAMINOPHEN 10-325 MG PO TABS: 1.0000 | ORAL_TABLET | Freq: Four times a day (QID) | ORAL | Status: DC | PRN

## 2013-09-04 NOTE — Progress Notes (Signed)
Patient ID: Gabriella Holmes, female   DOB: [birth year], 62 y.o.   MRN: [id number]  Chief Complaint  Patient presents with  . 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 She was able to finish the wedding festivities and presented to the emergency room on October 25 date of injury complaining of right shoulder pain. X-rays show a proximal humerus fracture with angulation displacement and greater tuberosity fracture. She is still having pain despite being in a sling and taking hydrocodone 10 mg with 500 mg of Tylenol. She complains of sharp dull 8/10 constant pain which is worse with motion. She did notice bruising and ecchymosis in the upper part of her arm she denies all problems under review of systems  She does have some hypertension and diabetes she had back surgery in 1989 successful. She had a right long finger trigger release and she's had a cholecystectomy  The past, family history and social history have been reviewed and are recorded in the corresponding sections of epic   BP 114/54  Ht 5\' 8"  (1.727 m)  Wt 245 lb (111.131 kg)  BMI 37.26 kg/m2  Lower extremity exam  Ambulation is normal.  The right and left lower extremity:  Inspection and palpation revealed no tenderness or abnormality in alignment in the lower extremities. Range of motion is full.  Strength is grade 5.  All joints are stable.   Her left upper extremity does not seem to be having any issues. No swelling. No contracture subluxation atrophy tremor or skin changes, neurovascular exam intact. Lymph nodes negative.  Right upper extremity his pain to her chest is swollen and tender proximally she can move her elbow hand and wrist normally muscle tone is normal skin shows a slight ecchymosis in the upper part of the arm elbow wrist and hand stability are normal  muscle tone normal  X-rays show a greater tuberosity fracture appears to be approximately 5-7 mm displaced there is angulation of the head shaft fragment.  Recommend CT scan to evaluate fracture fragments to determine if surgical treatment is needed.  I think in this case either way her shoulder function will decrease. However, depending on the CT scan surgical intervention may be necessary based on her functional level and right-hand-dominant.  Encounter Diagnosis  Name Primary?  . Humerus fracture, right, closed, initial encounter Yes    Meds ordered this encounter  Medications  . HYDROcodone-acetaminophen (NORCO) 10-325 MG per tablet    Sig: Take 1 tablet by mouth every 6 (six) hours as needed for pain.    Dispense:  60 tablet    Refill:  0

## 2013-09-04 NOTE — Telephone Encounter (Signed)
Patient has CT scan at [facility] on 09-06-13 at 7:15. Patient has [employer], authorization # [id number] and it expires on 10-03-13. Patient aware of the appointment.

## 2013-09-04 NOTE — Patient Instructions (Signed)
CT ordrered.

## 2013-09-06 ENCOUNTER — Ambulatory Visit (HOSPITAL_COMMUNITY)
Admission: RE | Admit: 2013-09-06 | Discharge: 2013-09-06 | Disposition: A | Discharge status: Home / Self Care | Payer: BC Managed Care – PPO | Source: Ambulatory Visit | Attending: Orthopedic Surgery | Admitting: Orthopedic Surgery

## 2013-09-06 DIAGNOSIS — X58XXXA Exposure to other specified factors, initial encounter: Secondary | ICD-10-CM | POA: Insufficient documentation

## 2013-09-06 DIAGNOSIS — S42213A Unspecified displaced fracture of surgical neck of unspecified humerus, initial encounter for closed fracture: Secondary | ICD-10-CM | POA: Insufficient documentation

## 2013-09-06 DIAGNOSIS — S42301A Unspecified fracture of shaft of humerus, right arm, initial encounter for closed fracture: Secondary | ICD-10-CM

## 2013-09-06 DIAGNOSIS — S42293A Other displaced fracture of upper end of unspecified humerus, initial encounter for closed fracture: Secondary | ICD-10-CM | POA: Insufficient documentation

## 2013-09-07 ENCOUNTER — Ambulatory Visit (INDEPENDENT_AMBULATORY_CARE_PROVIDER_SITE_OTHER): Payer: BC Managed Care – PPO | Admitting: Orthopedic Surgery

## 2013-09-07 ENCOUNTER — Encounter: Payer: Self-pay | Admitting: Orthopedic Surgery

## 2013-09-07 VITALS — BP 124/68 | Ht 68.0 in | Wt 245.0 lb

## 2013-09-07 DIAGNOSIS — S42309D Unspecified fracture of shaft of humerus, unspecified arm, subsequent encounter for fracture with routine healing: Secondary | ICD-10-CM

## 2013-09-07 DIAGNOSIS — S42309A Unspecified fracture of shaft of humerus, unspecified arm, initial encounter for closed fracture: Secondary | ICD-10-CM

## 2013-09-07 DIAGNOSIS — S42201D Unspecified fracture of upper end of right humerus, subsequent encounter for fracture with routine healing: Secondary | ICD-10-CM

## 2013-09-07 DIAGNOSIS — S42209A Unspecified fracture of upper end of unspecified humerus, initial encounter for closed fracture: Secondary | ICD-10-CM | POA: Insufficient documentation

## 2013-09-07 NOTE — Progress Notes (Signed)
Patient ID: Gabriella Holmes, female   DOB: 07-14-1951, 62 y.o.   MRN: [id number]  Chief Complaint  Patient presents with  . Results    CT results DOI 09/02/13    Status post fall injured right shoulder is three-part proximal humerus fracture  Patient CT scan for further evaluation the fracture  Patient is comfortable the discomfort is noted in the right shoulder, she does not have any paresthesias in the right hand  Her exam shows she is in a sling she can open and close her hand she has normal range of motion in the hand wrist. Muscle tone normal. Skin ecchymotic. Pulse and sensation normal. No lymph nodes. BP 124/68  Ht 5\' 8"  (1.727 m)  Wt 245 lb (111.131 kg)  BMI 37.26 kg/m2 General appearance is normal, the patient is alert and oriented x3 with normal mood and affect.  Right proximal humerus fracture three-part with greater tuberosity fragment. After reviewing the CT scan the greater tuberosity fragment is displaced proximally there is angulation at the fracture site although is less than 45.  The results are reviewed with the patient and the options of surgical versus nonoperative treatment are also discussed. Both options may result in decreased range of motion. With nonoperative treatment abduction may become difficult for elevation may become difficult secondary to the tuberosity displacement however the surgical option may and then decreased range of motion secondary to pain swelling length of surgery et Karie Soda.  We will x-ray again in 2 weeks the patient would like to think it over as to which way she wants to go I think plating would be necessary and repair the tuberosity fragment with heavy suture also be needed.

## 2013-09-07 NOTE — Patient Instructions (Signed)
Take ibuprofen   3 part proximal humerus fracture  [greater tuberosity fractured]

## 2013-09-13 ENCOUNTER — Other Ambulatory Visit: Payer: Self-pay | Admitting: Orthopedic Surgery

## 2013-09-14 ENCOUNTER — Telehealth: Payer: Self-pay | Admitting: Orthopedic Surgery

## 2013-09-14 ENCOUNTER — Encounter (HOSPITAL_COMMUNITY): Payer: Self-pay | Admitting: Pharmacy Technician

## 2013-09-14 NOTE — Telephone Encounter (Signed)
FYI.Marland KitchenMarland KitchenMarland KitchenMarland KitchenMarland KitchenRouting to Dr. Romeo Apple

## 2013-09-14 NOTE — Telephone Encounter (Signed)
Patient called to relay information - states has made decision to continue care and surgery in [facility], as wishes to use [facility], states surgery to be scheduled with Dr Ave Filter. States she really was pleased with Dr. Romeo Apple and the care here, and hopes she may contact Dr. Romeo Apple in future if any other orthopedic issue arises.  She thanks Dr. Romeo Apple for all, and is cancelling her upcoming appointment.

## 2013-09-20 ENCOUNTER — Encounter (HOSPITAL_COMMUNITY)
Admission: RE | Admit: 2013-09-20 | Discharge: 2013-09-20 | Disposition: A | Discharge status: Home / Self Care | Payer: BC Managed Care – PPO | Source: Ambulatory Visit | Attending: Anesthesiology | Admitting: Anesthesiology

## 2013-09-20 ENCOUNTER — Encounter (HOSPITAL_COMMUNITY)
Admission: RE | Admit: 2013-09-20 | Discharge: 2013-09-20 | Disposition: A | Discharge status: Home / Self Care | Payer: BC Managed Care – PPO | Source: Ambulatory Visit | Attending: Orthopedic Surgery | Admitting: Orthopedic Surgery

## 2013-09-20 ENCOUNTER — Encounter (HOSPITAL_COMMUNITY): Payer: Self-pay | Admitting: Pharmacy Technician

## 2013-09-20 ENCOUNTER — Encounter (HOSPITAL_COMMUNITY): Payer: Self-pay

## 2013-09-20 LAB — CBC
HCT: 40.3 % (ref 36.0–46.0)
Hemoglobin: 13.7 g/dL (ref 12.0–15.0)
MCH: 30.5 pg (ref 26.0–34.0)
MCHC: 34 g/dL (ref 30.0–36.0)
MCV: 89.8 fL (ref 78.0–100.0)
Platelets: 259 10*3/uL (ref 150–400)
RBC: 4.49 MIL/uL (ref 3.87–5.11)
RDW: 13.4 % (ref 11.5–15.5)
WBC: 9.4 10*3/uL (ref 4.0–10.5)

## 2013-09-20 LAB — BASIC METABOLIC PANEL
BUN: 23 mg/dL (ref 6–23)
CO2: 28 mEq/L (ref 19–32)
Calcium: 9.7 mg/dL (ref 8.4–10.5)
Chloride: 98 mEq/L (ref 96–112)
Creatinine, Ser: 0.61 mg/dL (ref 0.50–1.10)
GFR calc Af Amer: >90 mL/min (ref >90)
GFR calc non Af Amer: >90 mL/min (ref >90)
Glucose, Bld: 86 mg/dL (ref 70–99)
Potassium: 3.8 mEq/L (ref 3.5–5.1)
Sodium: 137 mEq/L (ref 135–145)

## 2013-09-20 MED ORDER — CEFAZOLIN SODIUM-DEXTROSE 2-3 GM-% IV SOLR
2.0000 g | INTRAVENOUS | Status: DC
  Filled 2013-09-20: qty 50

## 2013-09-20 NOTE — Pre-Procedure Instructions (Signed)
HULDA REDDIX  09/20/2013   Your procedure is scheduled on:  Thursday Nov. 13  Report to [facility] Main Entrance "A" at 9:00 AM.  Call this number if you have problems the morning of surgery: [phone]   Remember:   Do not eat food or drink liquids after midnight.   Take these medicines the morning of surgery with A SIP OF WATER: losartan   Do not wear jewelry, make-up or nail polish.  Do not wear lotions, powders, or perfumes. You may wear deodorant.  Do not shave 48 hours prior to surgery. Men may shave face and neck.  Do not bring valuables to the hospital.  [facility] is not responsible                  for any belongings or valuables.               Contacts, dentures or bridgework may not be worn into surgery.  Leave suitcase in the car. After surgery it may be brought to your room.  For patients admitted to the hospital, discharge time is determined by your                treatment team.               Patients discharged the day of surgery will not be allowed to drive  home.  Name and phone number of your driver:   Special Instructions: Shower using CHG 2 nights before surgery and the night before surgery.  If you shower the day of surgery use CHG.  Use special wash - you have one bottle of CHG for all showers.  You should use approximately 1/3 of the bottle for each shower.   Please read over the following fact sheets that you were given: Pain Booklet, Coughing and Deep Breathing and Surgical Site Infection Prevention

## 2013-09-20 NOTE — Progress Notes (Signed)
PCP: Dr. Verlon Setting  No cardiologist  Dr.Altimer-Endocrindologist

## 2013-09-21 ENCOUNTER — Ambulatory Visit: Payer: BC Managed Care – PPO | Admitting: Orthopedic Surgery

## 2013-09-21 ENCOUNTER — Encounter (HOSPITAL_COMMUNITY): Payer: Self-pay | Admitting: *Deleted

## 2013-09-21 ENCOUNTER — Encounter (HOSPITAL_COMMUNITY): Payer: BC Managed Care – PPO | Admitting: Anesthesiology

## 2013-09-21 ENCOUNTER — Ambulatory Visit (HOSPITAL_COMMUNITY): Payer: BC Managed Care – PPO

## 2013-09-21 ENCOUNTER — Encounter (HOSPITAL_COMMUNITY)
Admission: RE | Disposition: A | Discharge status: Home / Self Care | Payer: Self-pay | Source: Ambulatory Visit | Attending: Orthopedic Surgery

## 2013-09-21 ENCOUNTER — Ambulatory Visit (HOSPITAL_COMMUNITY): Payer: BC Managed Care – PPO | Admitting: Anesthesiology

## 2013-09-21 ENCOUNTER — Inpatient Hospital Stay (HOSPITAL_COMMUNITY)
Admission: RE | Admit: 2013-09-21 | Discharge: 2013-09-22 | DRG: 494 | Disposition: A | Discharge status: Home / Self Care | Payer: BC Managed Care – PPO | Source: Ambulatory Visit | Attending: Orthopedic Surgery | Admitting: Orthopedic Surgery

## 2013-09-21 DIAGNOSIS — Z87891 Personal history of nicotine dependence: Secondary | ICD-10-CM

## 2013-09-21 DIAGNOSIS — Z01812 Encounter for preprocedural laboratory examination: Secondary | ICD-10-CM

## 2013-09-21 DIAGNOSIS — E119 Type 2 diabetes mellitus without complications: Secondary | ICD-10-CM | POA: Diagnosis present

## 2013-09-21 DIAGNOSIS — S42201D Unspecified fracture of upper end of right humerus, subsequent encounter for fracture with routine healing: Secondary | ICD-10-CM

## 2013-09-21 DIAGNOSIS — S42213A Unspecified displaced fracture of surgical neck of unspecified humerus, initial encounter for closed fracture: Secondary | ICD-10-CM | POA: Diagnosis present

## 2013-09-21 DIAGNOSIS — Z79899 Other long term (current) drug therapy: Secondary | ICD-10-CM

## 2013-09-21 DIAGNOSIS — I10 Essential (primary) hypertension: Secondary | ICD-10-CM | POA: Diagnosis present

## 2013-09-21 DIAGNOSIS — W19XXXA Unspecified fall, initial encounter: Secondary | ICD-10-CM | POA: Diagnosis present

## 2013-09-21 DIAGNOSIS — Z01818 Encounter for other preprocedural examination: Secondary | ICD-10-CM

## 2013-09-21 DIAGNOSIS — Z7982 Long term (current) use of aspirin: Secondary | ICD-10-CM

## 2013-09-21 DIAGNOSIS — S42209A Unspecified fracture of upper end of unspecified humerus, initial encounter for closed fracture: Secondary | ICD-10-CM

## 2013-09-21 DIAGNOSIS — S42293A Other displaced fracture of upper end of unspecified humerus, initial encounter for closed fracture: Principal | ICD-10-CM | POA: Diagnosis present

## 2013-09-21 DIAGNOSIS — Z794 Long term (current) use of insulin: Secondary | ICD-10-CM

## 2013-09-21 DIAGNOSIS — E7800 Pure hypercholesterolemia, unspecified: Secondary | ICD-10-CM | POA: Diagnosis present

## 2013-09-21 HISTORY — PX: ORIF PROXIMAL HUMERUS FRACTURE: SUR951

## 2013-09-21 HISTORY — PX: ORIF HUMERUS FRACTURE: SHX2126

## 2013-09-21 HISTORY — DX: Type 2 diabetes mellitus without complications: E11.9

## 2013-09-21 LAB — GLUCOSE, CAPILLARY
Glucose-Capillary: 73 mg/dL (ref 70–99)
Glucose-Capillary: 77 mg/dL (ref 70–99)
Glucose-Capillary: 64 mg/dL — ABNORMAL LOW (ref 70–99)
Glucose-Capillary: 71 mg/dL (ref 70–99)
Glucose-Capillary: 106 mg/dL — ABNORMAL HIGH (ref 70–99)
Glucose-Capillary: 101 mg/dL — ABNORMAL HIGH (ref 70–99)
Glucose-Capillary: 98 mg/dL (ref 70–99)

## 2013-09-21 SURGERY — OPEN REDUCTION INTERNAL FIXATION (ORIF) PROXIMAL HUMERUS FRACTURE
Anesthesia: Choice | Site: Shoulder | Laterality: Right | Wound class: Clean

## 2013-09-21 MED ORDER — ATORVASTATIN CALCIUM 80 MG PO TABS
80.0000 mg | ORAL_TABLET | Freq: Every day | ORAL | Status: DC
Start: 2013-09-21 — End: 2013-09-22
  Administered 2013-09-21: 80 mg via ORAL
  Filled 2013-09-21 (×2): qty 1

## 2013-09-21 MED ORDER — MIDAZOLAM HCL 2 MG/ML PO SYRP
ORAL_SOLUTION | ORAL | Status: AC
  Filled 2013-09-21: qty 2

## 2013-09-21 MED ORDER — CANAGLIFLOZIN 300 MG PO TABS: 300.0000 mg | ORAL_TABLET | Freq: Every day | ORAL | Status: DC

## 2013-09-21 MED ORDER — CEFAZOLIN SODIUM-DEXTROSE 2-3 GM-% IV SOLR
INTRAVENOUS | Status: DC | PRN
  Administered 2013-09-21: 2 g via INTRAVENOUS

## 2013-09-21 MED ORDER — BISACODYL 10 MG RE SUPP: 10.0000 mg | Freq: Every day | RECTAL | Status: DC | PRN

## 2013-09-21 MED ORDER — ALUMINUM HYDROXIDE GEL 320 MG/5ML PO SUSP
15.0000 mL | ORAL | Status: DC | PRN
  Filled 2013-09-21: qty 30

## 2013-09-21 MED ORDER — DIPHENHYDRAMINE HCL 12.5 MG/5ML PO ELIX: 12.5000 mg | ORAL_SOLUTION | ORAL | Status: DC | PRN

## 2013-09-21 MED ORDER — KETOROLAC TROMETHAMINE 30 MG/ML IJ SOLN: 15.0000 mg | Freq: Once | INTRAMUSCULAR | Status: DC | PRN

## 2013-09-21 MED ORDER — SODIUM CHLORIDE 0.9 % IV SOLN
INTRAVENOUS | Status: DC
  Administered 2013-09-21 – 2013-09-22 (×2): via INTRAVENOUS

## 2013-09-21 MED ORDER — ONDANSETRON HCL 4 MG PO TABS: 4.0000 mg | ORAL_TABLET | Freq: Four times a day (QID) | ORAL | Status: DC | PRN

## 2013-09-21 MED ORDER — DOCUSATE SODIUM 100 MG PO CAPS
100.0000 mg | ORAL_CAPSULE | Freq: Two times a day (BID) | ORAL | Status: DC
  Administered 2013-09-21: 100 mg via ORAL
  Filled 2013-09-21 (×2): qty 1

## 2013-09-21 MED ORDER — EXENATIDE ER 2 MG ~~LOC~~ PEN: 2.0000 mg | PEN_INJECTOR | SUBCUTANEOUS | Status: DC

## 2013-09-21 MED ORDER — GLYCOPYRROLATE 0.2 MG/ML IJ SOLN
INTRAMUSCULAR | Status: DC | PRN
  Administered 2013-09-21: .8 mg via INTRAVENOUS

## 2013-09-21 MED ORDER — INSULIN GLARGINE 100 UNIT/ML ~~LOC~~ SOLN
30.0000 [IU] | Freq: Two times a day (BID) | SUBCUTANEOUS | Status: DC
  Administered 2013-09-21: 30 [IU] via SUBCUTANEOUS
  Filled 2013-09-21 (×3): qty 0.3

## 2013-09-21 MED ORDER — NEOSTIGMINE METHYLSULFATE 1 MG/ML IJ SOLN
INTRAMUSCULAR | Status: DC | PRN
  Administered 2013-09-21: 5 mg via INTRAVENOUS

## 2013-09-21 MED ORDER — PHENYLEPHRINE HCL 10 MG/ML IJ SOLN
10.0000 mg | INTRAMUSCULAR | Status: DC | PRN
  Administered 2013-09-21: 35 ug/min via INTRAVENOUS

## 2013-09-21 MED ORDER — MORPHINE SULFATE 2 MG/ML IJ SOLN: 1.0000 mg | INTRAMUSCULAR | Status: DC | PRN

## 2013-09-21 MED ORDER — ASPIRIN EC 325 MG PO TBEC
325.0000 mg | DELAYED_RELEASE_TABLET | Freq: Two times a day (BID) | ORAL | Status: DC
  Administered 2013-09-21 – 2013-09-22 (×2): 325 mg via ORAL
  Filled 2013-09-21 (×4): qty 1

## 2013-09-21 MED ORDER — OXYCODONE-ACETAMINOPHEN 5-325 MG PO TABS
1.0000 | ORAL_TABLET | ORAL | Status: DC | PRN
  Administered 2013-09-22: 2 via ORAL
  Filled 2013-09-21: qty 2

## 2013-09-21 MED ORDER — FENTANYL CITRATE 0.05 MG/ML IJ SOLN
50.0000 ug | INTRAMUSCULAR | Status: DC | PRN
  Administered 2013-09-21: 50 ug via INTRAVENOUS

## 2013-09-21 MED ORDER — LACTATED RINGERS IV SOLN
INTRAVENOUS | Status: DC | PRN
  Administered 2013-09-21 (×2): via INTRAVENOUS

## 2013-09-21 MED ORDER — CEFAZOLIN SODIUM-DEXTROSE 2-3 GM-% IV SOLR
2.0000 g | Freq: Four times a day (QID) | INTRAVENOUS | Status: AC
  Administered 2013-09-21 – 2013-09-22 (×3): 2 g via INTRAVENOUS
  Filled 2013-09-21 (×4): qty 50

## 2013-09-21 MED ORDER — ZOLPIDEM TARTRATE 5 MG PO TABS: 5.0000 mg | ORAL_TABLET | Freq: Every evening | ORAL | Status: DC | PRN

## 2013-09-21 MED ORDER — MIDAZOLAM HCL 2 MG/2ML IJ SOLN
INTRAMUSCULAR | Status: AC
  Filled 2013-09-21: qty 2

## 2013-09-21 MED ORDER — LOSARTAN POTASSIUM 50 MG PO TABS
100.0000 mg | ORAL_TABLET | Freq: Every day | ORAL | Status: DC
  Filled 2013-09-21: qty 2

## 2013-09-21 MED ORDER — LACTATED RINGERS IV SOLN: INTRAVENOUS | Status: DC

## 2013-09-21 MED ORDER — PHENOL 1.4 % MT LIQD: 1.0000 | OROMUCOSAL | Status: DC | PRN

## 2013-09-21 MED ORDER — METOCLOPRAMIDE HCL 5 MG PO TABS
5.0000 mg | ORAL_TABLET | Freq: Three times a day (TID) | ORAL | Status: DC | PRN
  Filled 2013-09-21: qty 2

## 2013-09-21 MED ORDER — LIDOCAINE HCL (CARDIAC) 20 MG/ML IV SOLN
INTRAVENOUS | Status: DC | PRN
  Administered 2013-09-21: 40 mg via INTRAVENOUS

## 2013-09-21 MED ORDER — ONDANSETRON HCL 4 MG/2ML IJ SOLN
INTRAMUSCULAR | Status: AC
  Administered 2013-09-21: 4 mg via INTRAVENOUS
  Filled 2013-09-21: qty 2

## 2013-09-21 MED ORDER — OXYCODONE HCL 5 MG PO TABS: 5.0000 mg | ORAL_TABLET | ORAL | Status: DC | PRN

## 2013-09-21 MED ORDER — METOCLOPRAMIDE HCL 5 MG/ML IJ SOLN: 5.0000 mg | Freq: Three times a day (TID) | INTRAMUSCULAR | Status: DC | PRN

## 2013-09-21 MED ORDER — METFORMIN HCL 500 MG PO TABS
1000.0000 mg | ORAL_TABLET | Freq: Two times a day (BID) | ORAL | Status: DC
  Administered 2013-09-21 – 2013-09-22 (×2): 1000 mg via ORAL
  Filled 2013-09-21 (×4): qty 2

## 2013-09-21 MED ORDER — 0.9 % SODIUM CHLORIDE (POUR BTL) OPTIME
TOPICAL | Status: DC | PRN
  Administered 2013-09-21: 1000 mL

## 2013-09-21 MED ORDER — POLYETHYLENE GLYCOL 3350 17 G PO PACK: 17.0000 g | PACK | Freq: Every day | ORAL | Status: DC | PRN

## 2013-09-21 MED ORDER — HYDROMORPHONE HCL PF 1 MG/ML IJ SOLN: 0.2500 mg | INTRAMUSCULAR | Status: DC | PRN

## 2013-09-21 MED ORDER — METFORMIN HCL 500 MG PO TABS: 1000.0000 mg | ORAL_TABLET | Freq: Two times a day (BID) | ORAL | Status: DC

## 2013-09-21 MED ORDER — FLEET ENEMA 7-19 GM/118ML RE ENEM: 1.0000 | ENEMA | Freq: Once | RECTAL | Status: AC | PRN

## 2013-09-21 MED ORDER — LOSARTAN POTASSIUM 50 MG PO TABS: 100.0000 mg | ORAL_TABLET | Freq: Every day | ORAL | Status: DC

## 2013-09-21 MED ORDER — INSULIN ASPART 100 UNIT/ML ~~LOC~~ SOLN: 0.0000 [IU] | Freq: Three times a day (TID) | SUBCUTANEOUS | Status: DC

## 2013-09-21 MED ORDER — ACETAMINOPHEN 650 MG RE SUPP: 650.0000 mg | Freq: Four times a day (QID) | RECTAL | Status: DC | PRN

## 2013-09-21 MED ORDER — POVIDONE-IODINE 7.5 % EX SOLN
Freq: Once | CUTANEOUS | Status: DC
  Filled 2013-09-21: qty 118

## 2013-09-21 MED ORDER — CANAGLIFLOZIN 100 MG PO TABS
300.0000 mg | ORAL_TABLET | Freq: Every day | ORAL | Status: DC
  Filled 2013-09-21 (×2): qty 3

## 2013-09-21 MED ORDER — ONDANSETRON HCL 4 MG/2ML IJ SOLN
4.0000 mg | Freq: Once | INTRAMUSCULAR | Status: AC | PRN
  Administered 2013-09-21: 4 mg via INTRAVENOUS

## 2013-09-21 MED ORDER — LACTATED RINGERS IV SOLN
INTRAVENOUS | Status: DC
  Administered 2013-09-21: 09:00:00 via INTRAVENOUS

## 2013-09-21 MED ORDER — MIDAZOLAM HCL 2 MG/2ML IJ SOLN
1.0000 mg | INTRAMUSCULAR | Status: DC | PRN
  Administered 2013-09-21: 1 mg via INTRAVENOUS

## 2013-09-21 MED ORDER — FENTANYL CITRATE 0.05 MG/ML IJ SOLN
INTRAMUSCULAR | Status: AC
  Filled 2013-09-21: qty 2

## 2013-09-21 MED ORDER — ARTIFICIAL TEARS OP OINT
TOPICAL_OINTMENT | OPHTHALMIC | Status: DC | PRN
  Administered 2013-09-21: 1 via OPHTHALMIC

## 2013-09-21 MED ORDER — MENTHOL 3 MG MT LOZG
1.0000 | LOZENGE | OROMUCOSAL | Status: DC | PRN
  Filled 2013-09-21: qty 9

## 2013-09-21 MED ORDER — CHLORTHALIDONE 25 MG PO TABS
25.0000 mg | ORAL_TABLET | ORAL | Status: DC
  Filled 2013-09-21: qty 1

## 2013-09-21 MED ORDER — HYDROCODONE-ACETAMINOPHEN 5-325 MG PO TABS: 1.0000 | ORAL_TABLET | ORAL | Status: DC | PRN

## 2013-09-21 MED ORDER — ONDANSETRON HCL 4 MG/2ML IJ SOLN
4.0000 mg | Freq: Four times a day (QID) | INTRAMUSCULAR | Status: DC | PRN
  Administered 2013-09-22: 4 mg via INTRAVENOUS
  Filled 2013-09-21: qty 2

## 2013-09-21 MED ORDER — ACETAMINOPHEN 325 MG PO TABS: 650.0000 mg | ORAL_TABLET | Freq: Four times a day (QID) | ORAL | Status: DC | PRN

## 2013-09-21 MED ORDER — ROCURONIUM BROMIDE 100 MG/10ML IV SOLN
INTRAVENOUS | Status: DC | PRN
  Administered 2013-09-21: 50 mg via INTRAVENOUS

## 2013-09-21 MED ORDER — PIOGLITAZONE HCL 15 MG PO TABS
15.0000 mg | ORAL_TABLET | Freq: Every day | ORAL | Status: DC
  Administered 2013-09-21: 15 mg via ORAL
  Filled 2013-09-21 (×2): qty 1

## 2013-09-21 MED ORDER — FENTANYL CITRATE 0.05 MG/ML IJ SOLN
INTRAMUSCULAR | Status: DC | PRN
  Administered 2013-09-21: 150 ug via INTRAVENOUS
  Administered 2013-09-21: 50 ug via INTRAVENOUS

## 2013-09-21 MED ORDER — MIDAZOLAM HCL 5 MG/5ML IJ SOLN
INTRAMUSCULAR | Status: DC | PRN
  Administered 2013-09-21: 1 mg via INTRAVENOUS

## 2013-09-21 MED ORDER — DEXTROSE 50 % IV SOLN
INTRAVENOUS | Status: AC
  Administered 2013-09-21: 25 mL
  Filled 2013-09-21: qty 50

## 2013-09-21 MED ORDER — PROPOFOL 10 MG/ML IV BOLUS
INTRAVENOUS | Status: DC | PRN
  Administered 2013-09-21: 150 mg via INTRAVENOUS

## 2013-09-21 SURGICAL SUPPLY — 79 items
BIT DRILL 2.8X4 QC CORT (BIT) ×2 IMPLANT
BIT DRILL 4 LONG FAST STEP (BIT) ×2 IMPLANT
BIT DRILL 4 SHORT (TRAUMA) ×2 IMPLANT
BONE CANC CHIPS 20CC PCAN1/4 (Bone Implant) ×2 IMPLANT
CHIPS CANC BONE 20CC PCAN1/4 (Bone Implant) ×1 IMPLANT
CHLORAPREP W/TINT 26ML (MISCELLANEOUS) ×2 IMPLANT
CLOTH BEACON ORANGE TIMEOUT ST (SAFETY) IMPLANT
COVER SURGICAL LIGHT HANDLE (MISCELLANEOUS) ×2 IMPLANT
DRAPE C-ARM 42X72 X-RAY (DRAPES) ×2 IMPLANT
DRAPE INCISE IOBAN 66X45 STRL (DRAPES) ×4 IMPLANT
DRAPE PROXIMA HALF (DRAPES) ×2 IMPLANT
DRAPE SURG 17X23 STRL (DRAPES) ×2 IMPLANT
DRAPE U-SHAPE 47X51 STRL (DRAPES) ×2 IMPLANT
DRSG EMULSION OIL 3X3 NADH (GAUZE/BANDAGES/DRESSINGS) IMPLANT
DRSG MEPILEX BORDER 4X12 (GAUZE/BANDAGES/DRESSINGS) ×2 IMPLANT
DRSG MEPILEX BORDER 4X8 (GAUZE/BANDAGES/DRESSINGS) IMPLANT
DRSG PAD ABDOMINAL 8X10 ST (GAUZE/BANDAGES/DRESSINGS) ×2 IMPLANT
ELECT BLADE 4.0 EZ CLEAN MEGAD (MISCELLANEOUS) ×2
ELECT REM PT RETURN 9FT ADLT (ELECTROSURGICAL)
ELECTRODE BLDE 4.0 EZ CLN MEGD (MISCELLANEOUS) ×1 IMPLANT
ELECTRODE REM PT RTRN 9FT ADLT (ELECTROSURGICAL) IMPLANT
GLOVE BIO SURGEON STRL SZ 6.5 (GLOVE) ×2 IMPLANT
GLOVE BIO SURGEON STRL SZ7 (GLOVE) ×2 IMPLANT
GLOVE BIO SURGEON STRL SZ7.5 (GLOVE) ×4 IMPLANT
GLOVE BIOGEL PI IND STRL 6.5 (GLOVE) ×2 IMPLANT
GLOVE BIOGEL PI IND STRL 7.0 (GLOVE) ×3 IMPLANT
GLOVE BIOGEL PI IND STRL 8 (GLOVE) ×1 IMPLANT
GLOVE BIOGEL PI INDICATOR 6.5 (GLOVE) ×2
GLOVE BIOGEL PI INDICATOR 7.0 (GLOVE) ×3
GLOVE BIOGEL PI INDICATOR 8 (GLOVE) ×1
GLOVE ECLIPSE 6.5 STRL STRAW (GLOVE) ×2 IMPLANT
GLOVE SURG SS PI 7.0 STRL IVOR (GLOVE) ×4 IMPLANT
GOWN PREVENTION PLUS LG XLONG (DISPOSABLE) ×2 IMPLANT
GOWN PREVENTION PLUS XLARGE (GOWN DISPOSABLE) ×6 IMPLANT
GOWN STRL NON-REIN LRG LVL3 (GOWN DISPOSABLE) ×4 IMPLANT
KIT BASIN OR (CUSTOM PROCEDURE TRAY) ×2 IMPLANT
KIT ROOM TURNOVER OR (KITS) ×2 IMPLANT
MANIFOLD NEPTUNE II (INSTRUMENTS) ×2 IMPLANT
NDL SUT 2 .5 CRC MAYO 1.732X (NEEDLE) IMPLANT
NDL SUT 6 .5 CRC .975X.05 MAYO (NEEDLE) ×1 IMPLANT
NEEDLE 22X1 1/2 (OR ONLY) (NEEDLE) ×2 IMPLANT
NEEDLE MAYO TAPER (NEEDLE) ×2
NS IRRIG 1000ML POUR BTL (IV SOLUTION) ×2 IMPLANT
PACK SHOULDER (CUSTOM PROCEDURE TRAY) ×2 IMPLANT
PAD ARMBOARD 7.5X6 YLW CONV (MISCELLANEOUS) ×4 IMPLANT
PEG STND 4.0X32.5MM (Orthopedic Implant) ×2 IMPLANT
PEG STND 4.0X45.0MM (Orthopedic Implant) ×2 IMPLANT
PEG STND 4.0X50.0MM (Orthopedic Implant) ×2 IMPLANT
PEG STND 4.0X52.5MM (Orthopedic Implant) ×2 IMPLANT
PEG STND 4.0X57.5MM (Orthopedic Implant) ×2 IMPLANT
PEGSTD 4.0X32.5MM (Orthopedic Implant) ×1 IMPLANT
PEGSTD 4.0X45.0MM (Orthopedic Implant) ×1 IMPLANT
PEGSTD 4.0X50.0MM (Orthopedic Implant) ×1 IMPLANT
PIN GUIDE SHOULDER 2.0MM (PIN) ×2 IMPLANT
PLATE SHOULDER S3 3HOLE RT (Plate) ×2 IMPLANT
SCREW LOCK 90D ANGLED 3.8X30 (Screw) ×4 IMPLANT
SCREW MULTIDIR 3.8X32 HUMRL (Screw) ×2 IMPLANT
SLING ARM FOAM STRAP MED (SOFTGOODS) ×2 IMPLANT
SPONGE GAUZE 4X4 12PLY (GAUZE/BANDAGES/DRESSINGS) IMPLANT
SPONGE LAP 18X18 X RAY DECT (DISPOSABLE) ×2 IMPLANT
STAPLER VISISTAT 35W (STAPLE) IMPLANT
STRIP CLOSURE SKIN 1/2X4 (GAUZE/BANDAGES/DRESSINGS) ×2 IMPLANT
SUCTION FRAZIER TIP 10 FR DISP (SUCTIONS) ×2 IMPLANT
SUPPORT WRAP ARM LG (MISCELLANEOUS) ×2 IMPLANT
SUT BONE WAX W31G (SUTURE) ×2 IMPLANT
SUT ETHIBOND NAB CT1 #1 30IN (SUTURE) ×4 IMPLANT
SUT FIBERWIRE #2 38 T-5 BLUE (SUTURE) ×8
SUT MNCRL AB 4-0 PS2 18 (SUTURE) ×2 IMPLANT
SUT VIC AB 0 CT1 27 (SUTURE) ×2
SUT VIC AB 0 CT1 27XBRD ANBCTR (SUTURE) ×1 IMPLANT
SUT VIC AB 2-0 CT1 27 (SUTURE) ×4
SUT VIC AB 2-0 CT1 TAPERPNT 27 (SUTURE) ×2 IMPLANT
SUT VICRYL 4-0 PS2 18IN ABS (SUTURE) IMPLANT
SUTURE FIBERWR #2 38 T-5 BLUE (SUTURE) ×4 IMPLANT
SYR CONTROL 10ML LL (SYRINGE) ×2 IMPLANT
TOWEL OR 17X24 6PK STRL BLUE (TOWEL DISPOSABLE) ×2 IMPLANT
TOWEL OR 17X26 10 PK STRL BLUE (TOWEL DISPOSABLE) ×2 IMPLANT
WATER STERILE IRR 1000ML POUR (IV SOLUTION) IMPLANT
YANKAUER SUCT BULB TIP NO VENT (SUCTIONS) ×2 IMPLANT

## 2013-09-21 NOTE — Progress Notes (Signed)
Pts CBG 64 upon arrival to pacu.  Pt. Is alert. Drinking some ginger ale.  CBG up to 71.  Dr. Noreene Larsson notified. Pt. Is now nauseated.  Pt states she took a full dose of Lantus last night.  Dextrose 50% 25 mls given.

## 2013-09-21 NOTE — Op Note (Signed)
Procedure(s): OPEN REDUCTION INTERNAL FIXATION (ORIF) PROXIMAL HUMERUS FRACTURE Procedure Note  Gabriella Holmes female 62 y.o. 09/21/2013  Procedure(s) and Anesthesia Type:   OPEN REDUCTION INTERNAL FIXATION (ORIF) right 3 part PROXIMAL HUMERUS FRACTURE - Choice  Surgeon(s) and Role:    * Mable Paris, MD - Primary   Indications:  62 y.o. female s/p fall with right displaced three-part proximal humerus fracture.  We discussed operative and nonoperative treatment options. She was indicated for surgical treatment given the displacement of fracture to try and decrease risk of malunion and persistent pain and dysfunction. She understands there is risk of osteonecrosis with or without treatment. She understands this may necessitate an arthroplasty procedure in the future.     Surgeon: Mable Paris   Assistants: Damita Lack PA-C [facility] was present and scrubbed throughout the procedure and was essential in positioning, retraction, exposure, and closure)  Anesthesia: General endotracheal anesthesia with preoperative interscalene block given by the attending anesthesiologist    Procedure Detail  OPEN REDUCTION INTERNAL FIXATION (ORIF) PROXIMAL HUMERUS FRACTURE  Findings: The fracture was more comminuted than expected. Bone quality was poor. The head was brought out of valgus alignment and the greater tuberosity was grossly reduced around anteriorly. In an effort to obtain bony healing and ensure that the tuberosity was below the head the tuberosity and up slightly over reduced inferiorly. The tuberosity fixation was entirely sutures as it was not contained under the plate, as it was too posterior.   Estimated Blood Loss:  300 mL         Drains: none  Blood Given: none         Specimens: none        Complications:  * No complications entered in OR log *         Disposition: PACU - hemodynamically stable.         Condition: stable     Procedure:  DESCRIPTION OF PROCEDURE: The patient was identified in preoperative  holding area where I personally marked the operative site after  verifying site, side, and procedure with the patient. The patient was taken back  to the operating room where general anesthesia was induced without  complication and was placed in the beach-chair position with the back  elevated about 40 degrees and all extremities carefully padded and  positioned.  The patient did receive preoperative antibiotics. A. approximately 12 cm incision was made from the palpable coracoid tip to the Center point of the axilla on the humerus. Dissection was carried down to subcutaneous fat to the level of the cephalic vein which was identified and taken laterally with the deltoid. The deltopectoral interval was used. The fracture was identified and exposed. It was debrided of hematoma and early callus formation to expose the fracture site. Cobb elevator was  gently used to free up the separate fracture fragments and mobilize the shaft and head fragment. Once adequate mobilized the blunt-tipped impactor was used to advance the superior humeral head medially bringing it out of valgus. The greater tuberosity was controlled with 3 #2 fiber wires at the bone tendon junction and was brought around anteriorly once the head was brought out of valgus. The separate #2 FiberWire was used in the lesser tuberosity/subscapularis to control the anterior segment. Once the head segment was reduced there was a moderate avoid a bone impaction and therefore about 10 cc of cancellus bone graft was packed in. The fracture was again reduced and the plate was laid lateral to the  bicipital groove. The guidepin was placed to gross estimate height and once fluoroscopic imaging demonstrated appropriate height the distal oblong screw was placed. 5 distal locking pegs were then placed and it was felt that none of them penetrated the articular surface. This kept  the head in a near-anatomic position and the medial hinge was felt to be intact. The greater tuberosity was then affixed to the plate with suture fixation using 3 #2 fiber wires in the lesser tuberosity was fixed using the anterior #2 FiberWire. Final x-ray showed greater tuberosity to be over reduced slightly but was felt this overlap may help in healing and that further adjustment could cause further damage to the bone and higher likelihood of avascular necrosis. With gentle internal and external rotation the entire construct was moving as a unit. The wound is copiously irrigated with normal saline and subsequently closed in layers with 2-0 Vicryl in deep dermal layer and 4-0 Monocryl for skin closure. Light sterile dressing was applied. The patient was placed in a sling, allowed to awaken from general anesthesia, transferred to stretcher and taken to recovery room stable condition.  POSTOPERATIVE PLAN: She will be kept overnight for pain control and  antibiotics, and will likely be discharged in the morning in a sling.  She will remain in the sling for about 4 weeks postoperatively.

## 2013-09-21 NOTE — Preoperative (Signed)
Beta Blockers   Reason not to administer Beta Blockers:Not Applicable 

## 2013-09-21 NOTE — Transfer of Care (Signed)
Immediate Anesthesia Transfer of Care Note  Patient: Gabriella Holmes  Procedure(s) Performed: Procedure(s) with comments: OPEN REDUCTION INTERNAL FIXATION (ORIF) PROXIMAL HUMERUS FRACTURE (Right) - Right open reduction internal fixation humerus fracture  Patient Location: PACU  Anesthesia Type:General  Level of Consciousness: awake  Airway & Oxygen Therapy: Patient Spontanous Breathing and Patient connected to face mask oxygen  Post-op Assessment: Report given to PACU RN, Post -op Vital signs reviewed and stable and Patient moving all extremities X 4  Post vital signs: Reviewed and stable  Complications: No apparent anesthesia complications

## 2013-09-21 NOTE — Discharge Instructions (Signed)
Discharge Instructions after Open Shoulder Repair  A sling has been provided for you. Remain in your sling at all times. This includes sleeping in your sling.  Use ice on the shoulder intermittently over the first 48 hours after surgery.  Pain medicine has been prescribed for you.  Use your medicine liberally over the first 48 hours, and then you can begin to taper your use. You may take Extra Strength Tylenol or Tylenol only in place of the pain pills. DO NOT take ANY nonsteroidal anti-inflammatory pain medications: Advil, Motrin, Ibuprofen, Aleve, Naproxen or Naprosyn.  You may remove your dressing after two days  You may shower 5 days after surgery. The incisions CANNOT get wet prior to 5 days. Simply allow the water to wash over the site and then pat dry. Do not rub the incisions. Make sure your axilla (armpit) is completely dry after showering.  Take one aspirin, a day for 2 weeks after surgery, unless you have an aspirin sensitivity/ allergy or asthma.   Please call [phone] during normal business hours or [phone] after hours for any problems. Including the following:  - excessive redness of the incisions - drainage for more than 4 days - fever of more than 101.5 F  *Please note that pain medications will not be refilled after hours or on weekends.

## 2013-09-21 NOTE — Anesthesia Postprocedure Evaluation (Signed)
  Anesthesia Post-op Note  Patient: Gabriella Holmes  Procedure(s) Performed: Procedure(s) with comments: OPEN REDUCTION INTERNAL FIXATION (ORIF) PROXIMAL HUMERUS FRACTURE (Right) - Right open reduction internal fixation humerus fracture  Patient Location: PACU  Anesthesia Type:General and GA combined with regional for post-op pain  Level of Consciousness: awake, alert  and oriented  Airway and Oxygen Therapy: Patient Spontanous Breathing and Patient connected to nasal cannula oxygen  Post-op Pain: none  Post-op Assessment: Post-op Vital signs reviewed, Patient's Cardiovascular Status Stable, Respiratory Function Stable, Patent Airway and Pain level controlled  Post-op Vital Signs: stable  Complications: No apparent anesthesia complications

## 2013-09-21 NOTE — Anesthesia Preprocedure Evaluation (Signed)
Anesthesia Evaluation  Patient identified by MRN, date of birth, ID band Patient awake    Reviewed: Allergy & Precautions, H&P , NPO status , Patient's Chart, lab work & pertinent test results, reviewed documented beta blocker date and time   Airway Mallampati: II TM Distance: >3 FB Neck ROM: Full    Dental  (+) Teeth Intact and Dental Advisory Given   Pulmonary former smoker,  breath sounds clear to auscultation        Cardiovascular hypertension, Rhythm:Regular Rate:Normal     Neuro/Psych    GI/Hepatic   Endo/Other    Renal/GU      Musculoskeletal   Abdominal (+) + obese,   Peds  Hematology   Anesthesia Other Findings   Reproductive/Obstetrics                           Anesthesia Physical Anesthesia Plan  ASA: III  Anesthesia Plan: General   Post-op Pain Management:    Induction: Intravenous  Airway Management Planned: Oral ETT  Additional Equipment:   Intra-op Plan:   Post-operative Plan: Extubation in OR  Informed Consent: I have reviewed the patients History and Physical, chart, labs and discussed the procedure including the risks, benefits and alternatives for the proposed anesthesia with the patient or authorized representative who has indicated his/her understanding and acceptance.   Dental advisory given  Plan Discussed with: CRNA and Anesthesiologist  Anesthesia Plan Comments:         Anesthesia Quick Evaluation

## 2013-09-21 NOTE — H&P (Signed)
Gabriella Holmes is an 62 y.o. female.   Chief Complaint: R shoulder pain/fracture HPI: S/p fall with R displaced 3 part proximal humerus fracture.  Past Medical History  Diagnosis Date  . Diabetes mellitus without complication   . Hypertension   . GERD (gastroesophageal reflux disease)   . High cholesterol     Past Surgical History  Procedure Laterality Date  . Cholecystectomy    . Back surgery    . Tubal ligation    . Trigger finger release    . Cyst excision      from facial area.     History reviewed. No pertinent family history. Social History:  reports that she has quit smoking. Her smoking use included Cigarettes. She has a 2.5 pack-year smoking history. She does not have any smokeless tobacco history on file. She reports that she drinks alcohol. She reports that she does not use illicit drugs.  Allergies: No Known Allergies  Medications Prior to Admission  Medication Sig Dispense Refill  . aspirin EC 81 MG tablet Take 81 mg by mouth daily.      Marland Kitchen atorvastatin (LIPITOR) 80 MG tablet Take 80 mg by mouth at bedtime.      . Calcium Carbonate-Vitamin D (CALTRATE 600+D PO) Take 1 tablet by mouth 2 (two) times daily.      . Canagliflozin (INVOKANA) 300 MG TABS Take 300 mg by mouth daily.      . chlorthalidone (HYGROTON) 25 MG tablet Take 25 mg by mouth every other day.      . cholecalciferol (VITAMIN D) 1000 UNITS tablet Take 1,000 Units by mouth at bedtime.      . cyanocobalamin (,VITAMIN B-12,) 1000 MCG/ML injection Inject 1,000 mcg into the muscle every 30 (thirty) days. First of each month      . Exenatide ER (BYDUREON) 2 MG PEN Inject 2 mg into the skin every 7 (seven) days. Sundays      . ibuprofen (ADVIL,MOTRIN) 200 MG tablet Take 400 mg by mouth daily as needed for moderate pain.       Marland Kitchen insulin glargine (LANTUS) 100 UNIT/ML injection Inject 30 Units into the skin 2 (two) times daily.      Marland Kitchen losartan (COZAAR) 100 MG tablet Take 100 mg by mouth daily.      . metFORMIN  (GLUCOPHAGE) 1000 MG tablet Take 1,000 mg by mouth 2 (two) times daily with a meal.      . Multiple Vitamin (MULTIVITAMIN) tablet Take 1 tablet by mouth daily.      . pioglitazone (ACTOS) 30 MG tablet Take 15 mg by mouth daily.        Results for orders placed during the hospital encounter of 09/21/13 (from the past 48 hour(s))  GLUCOSE, CAPILLARY     Status: None   Collection Time    09/21/13  8:51 AM      Result Value Range   Glucose-Capillary 73  70 - 99 mg/dL   Comment 1 Notify RN     Dg Chest 2 View  09/20/2013   CLINICAL DATA:  Pre admit right humerus surgery  EXAM: CHEST  2 VIEW  COMPARISON:  03/04/2012  FINDINGS: Streaky density right upper lobe unchanged and consistent with scarring. Negative for heart failure or pneumonia. Negative for mass lesion.  Fracture of the proximal right humerus.  IMPRESSION: Right upper lobe scarring.  No acute abnormality.   Electronically Signed   By: Marlan Palau M.D.   On: 09/20/2013 13:12  Review of Systems  All other systems reviewed and are negative.    Blood pressure 169/65, pulse 81, temperature 97.2 F (36.2 C), temperature source Oral, resp. rate 23, SpO2 100.00%. Physical Exam  Constitutional: She is oriented to person, place, and time. She appears well-developed and well-nourished.  HENT:  Head: Atraumatic.  Eyes: EOM are normal.  Cardiovascular: Intact distal pulses.   Respiratory: Effort normal.  Musculoskeletal:       Right shoulder: She exhibits decreased range of motion, tenderness and swelling. She exhibits normal pulse.  Neurological: She is alert and oriented to person, place, and time.  Skin: Skin is warm and dry.  Psychiatric: She has a normal mood and affect.     Assessment/Plan R displaced 3 part proximal humerus fracture Plan ORIF Risks / benefits of surgery discussed Consent on chart  NPO for OR Preop antibiotics   [name] WILLIAM 09/21/2013, 9:34 AM

## 2013-09-21 NOTE — Anesthesia Procedure Notes (Addendum)
Anesthesia Regional Block:  Interscalene brachial plexus block  Pre-Anesthetic Checklist: ,, timeout performed, Correct Patient, Correct Site, Correct Laterality, Correct Procedure, Correct Position, site marked, Risks and benefits discussed,  Surgical consent,  Pre-op evaluation,  At surgeon's request and post-op pain management  Laterality: Right  Prep: chloraprep       Needles:  Injection technique: Single-shot  Needle Type: Tuohy          Additional Needles:  Procedures: ultrasound guided (picture in chart) Interscalene brachial plexus block Narrative:  Start time: 09/21/2013 10:00 AM End time: 09/21/2013 10:05 AM Injection made incrementally with aspirations every 5 mL.  Performed by: Personally   Additional Notes: 25 cc 0.5% marcaine with 1:200 Epi and 5 mg decadron injected easily   Procedure Name: Intubation Date/Time: 09/21/2013 10:47 AM Performed by: Cathie Olden B Pre-anesthesia Checklist: Patient identified, Emergency Drugs available, Suction available, Patient being monitored and Timeout performed Patient Re-evaluated:Patient Re-evaluated prior to inductionOxygen Delivery Method: Circle system utilized Preoxygenation: Pre-oxygenation with 100% oxygen Intubation Type: IV induction Ventilation: Mask ventilation without difficulty and Oral airway inserted - appropriate to patient size Laryngoscope Size: Mac and 3 Grade View: Grade II Tube type: Oral Number of attempts: 1 Airway Equipment and Method: Oral airway and Stylet Placement Confirmation: ETT inserted through vocal cords under direct vision,  positive ETCO2 and breath sounds checked- equal and bilateral Secured at: 22 cm Tube secured with: Tape Dental Injury: Teeth and Oropharynx as per pre-operative assessment

## 2013-09-22 LAB — BASIC METABOLIC PANEL
BUN: 17 mg/dL (ref 6–23)
CO2: 27 mEq/L (ref 19–32)
Calcium: 8.5 mg/dL (ref 8.4–10.5)
Chloride: 102 mEq/L (ref 96–112)
Creatinine, Ser: 0.64 mg/dL (ref 0.50–1.10)
GFR calc Af Amer: >90 mL/min (ref >90)
GFR calc non Af Amer: >90 mL/min (ref >90)
Glucose, Bld: 89 mg/dL (ref 70–99)
Potassium: 3.4 mEq/L — ABNORMAL LOW (ref 3.5–5.1)
Sodium: 139 mEq/L (ref 135–145)

## 2013-09-22 LAB — CBC
HCT: 33.3 % — ABNORMAL LOW (ref 36.0–46.0)
Hemoglobin: 11.5 g/dL — ABNORMAL LOW (ref 12.0–15.0)
MCH: 30.7 pg (ref 26.0–34.0)
MCHC: 34.5 g/dL (ref 30.0–36.0)
MCV: 88.8 fL (ref 78.0–100.0)
Platelets: 200 10*3/uL (ref 150–400)
RBC: 3.75 MIL/uL — ABNORMAL LOW (ref 3.87–5.11)
RDW: 13.7 % (ref 11.5–15.5)
WBC: 7 10*3/uL (ref 4.0–10.5)

## 2013-09-22 LAB — GLUCOSE, CAPILLARY: Glucose-Capillary: 85 mg/dL (ref 70–99)

## 2013-09-22 MED ORDER — OXYCODONE-ACETAMINOPHEN 5-325 MG PO TABS: 1.0000 | ORAL_TABLET | ORAL | Status: DC | PRN

## 2013-09-22 NOTE — Care Management Utilization Note (Signed)
Utilization review completed. Jeanette Moffatt, RN BSN 

## 2013-09-22 NOTE — Evaluation (Signed)
Occupational Therapy Evaluation and Discharge Patient Details Name: Gabriella Holmes MRN: [id number] DOB: 1951-07-21 Today's Date: 09/22/2013 Time: 3086-5784 OT Time Calculation (min): 15 min  OT Assessment / Plan / Recommendation History of present illness OPEN REDUCTION INTERNAL FIXATION (ORIF) right 3 part PROXIMAL HUMERUS FRACTURE    Clinical Impression   This 62 yo female admitted and s/p above presents to acute OT with all education completed and handout given. No further OT needs, will sign off.    OT Assessment  Progress rehab of shoulder as ordered by MD at follow-up appointment    Follow Up Recommendations  No OT follow up       Equipment Recommendations  None recommended by OT          Precautions / Restrictions Precautions Precautions: Shoulder Shoulder Interventions: Shoulder sling/immobilizer;Off for dressing/bathing/exercises Required Braces or Orthoses: Sling Restrictions Weight Bearing Restrictions: Yes Other Position/Activity Restrictions: NWM RUE       ADL  Transfers/Ambulation Related to ADLs: Independent with all ADL Comments: Went over how to perform UB BADLs using gravity to A her to bring her arm away from her body, elbow exercises, how to cover arm with Saran Wrap Press-n-Seal to keep bandage and incision dry; using her gold bond powder under her arm for deodorant and gave pt the OT post op shoulder handout with all education.     Acute Rehab OT Goals Patient Stated Goal: Home today  Visit Information  Last OT Received On: 09/22/13 Assistance Needed: +1 History of Present Illness: OPEN REDUCTION INTERNAL FIXATION (ORIF) right 3 part PROXIMAL HUMERUS FRACTURE        Prior Functioning     Home Living Family/patient expects to be discharged to:: Private residence Living Arrangements: Spouse/significant other Available Help at Discharge: Family;Available 24 hours/day Prior Function Level of Independence: Independent (until 3 weeks ago when  she broke her arm) Communication Communication: No difficulties Dominant Hand: Right         Vision/Perception Vision - History Patient Visual Report: No change from baseline   Cognition  Cognition Arousal/Alertness: Awake/alert Behavior During Therapy: WFL for tasks assessed/performed Overall Cognitive Status: Within Functional Limits for tasks assessed    Extremity/Trunk Assessment Upper Extremity Assessment Upper Extremity Assessment: RUE deficits/detail RUE Deficits / Details: 3 part humerus fx so no ROM at shoulder at this time; decreased elbow extension due to has been in sling x 3 weeks; digits WNL RUE Coordination: decreased gross motor     Mobility Transfers Transfers: Sit to Stand;Stand to Sit Sit to Stand: 6: Modified independent (Device/Increase time);With upper extremity assist;From chair/3-in-1 (LUE) Stand to Sit: 6: Modified independent (Device/Increase time);With upper extremity assist;To chair/3-in-1 (LUE)     Exercise Donning/doffing shirt without moving shoulder: Independent Method for sponge bathing under operated UE: Independent Donning/doffing sling/immobilizer: Independent Correct positioning of sling/immobilizer: Independent Pendulum exercises (written home exercise program):  (NA) ROM for elbow, wrist and digits of operated UE: Independent Sling wearing schedule (on at all times/off for ADL's): Independent Proper positioning of operated UE when showering: Independent Dressing change:  (NA) Positioning of UE while sleeping: Independent      End of Session OT - End of Session Activity Tolerance: Patient tolerated treatment well Patient left: in chair;with family/visitor present       Evette Georges [phone] 09/22/2013, 11:46 AM

## 2013-09-22 NOTE — Progress Notes (Signed)
PATIENT ID: Gabriella Holmes   1 Day Post-Op Procedure(s) (LRB): OPEN REDUCTION INTERNAL FIXATION (ORIF) PROXIMAL HUMERUS FRACTURE (Right)  Subjective: Doing well, pain well controlled.  Objective:  Filed Vitals:   09/22/13 0535  BP: 127/51  Pulse: 78  Temp: 98.5 F (36.9 C)  Resp: 16     R shoulder dressing c/d/i NVID  Labs:   Recent Labs  09/20/13 0930 09/22/13 0610  HGB 13.7 11.5*   Recent Labs  09/20/13 0930 09/22/13 0610  WBC 9.4 7.0  RBC 4.49 3.75*  HCT 40.3 33.3*  PLT 259 200   Recent Labs  09/20/13 0930 09/22/13 0610  NA 137 139  K 3.8 3.4*  CL 98 102  CO2 28 27  BUN 23 17  CREATININE 0.61 0.64  GLUCOSE 86 89  CALCIUM 9.7 8.5    Assessment and Plan:Doing well POD1 Plan d/c home today Hand/elbow motion only, sling at all times F/u 2 wks.  VTE proph: ECASA BID/ SCDs

## 2013-09-22 NOTE — Discharge Summary (Signed)
Patient ID: Gabriella Holmes MRN: [id number] DOB/AGE: 1951-02-12 62 y.o.  Admit date: 09/21/2013 Discharge date: 09/22/2013  Admission Diagnoses:  Principal Problem:   Fracture of proximal humerus   Discharge Diagnoses:  Same  Past Medical History  Diagnosis Date  . Hypertension   . GERD (gastroesophageal reflux disease)   . High cholesterol   . Type II diabetes mellitus     Surgeries: Procedure(s): OPEN REDUCTION INTERNAL FIXATION (ORIF) PROXIMAL HUMERUS FRACTURE on 09/21/2013   Consultants:    Discharged Condition: Improved  Hospital Course: Gabriella Holmes is an 62 y.o. female who was admitted 09/21/2013 for operative treatment ofFracture of proximal humerus. s/p fall with right displaced three-part proximal humerus fracture. We discussed operative and nonoperative treatment options. She was indicated for surgical treatment given the displacement of fracture to try and decrease risk of malunion and persistent pain and dysfunction. She understands there is risk of osteonecrosis with or without treatment. She understands this may necessitate an arthroplasty procedure in the future. After pre-op clearance the patient was taken to the operating room on 09/21/2013 and underwent  Procedure(s): OPEN REDUCTION INTERNAL FIXATION (ORIF) PROXIMAL HUMERUS FRACTURE.    Patient was given perioperative antibiotics: Anti-infectives   Start     Dose/Rate Route Frequency Ordered Stop   09/21/13 1700  ceFAZolin (ANCEF) IVPB 2 g/50 mL premix     2 g 100 mL/hr over 30 Minutes Intravenous Every 6 hours 09/21/13 1335 09/22/13 0506   09/21/13 0600  ceFAZolin (ANCEF) IVPB 2 g/50 mL premix  Status:  Discontinued     2 g 100 mL/hr over 30 Minutes Intravenous On call to O.R. 09/20/13 1404 09/21/13 1335       Patient was given sequential compression devices, early ambulation, and ASA 325mg  BID to prevent DVT.  Patient benefited maximally from hospital stay and there were no complications.     Recent vital signs: Patient Vitals for the past 24 hrs:  BP Temp Temp src Pulse Resp SpO2  09/22/13 0535 127/51 mmHg 98.5 F (36.9 C) Oral 78 16 98 %  09/22/13 0230 128/56 mmHg 99.2 F (37.3 C) Oral 90 16 96 %  09/21/13 2019 127/54 mmHg 99.9 F (37.7 C) Oral 94 16 95 %  09/21/13 1546 108/53 mmHg 98.2 F (36.8 C) - 83 16 94 %  09/21/13 1500 91/37 mmHg 98.2 F (36.8 C) - 76 12 98 %  09/21/13 1445 94/36 mmHg - - 72 13 97 %  09/21/13 1430 109/43 mmHg - - 69 16 97 %  09/21/13 1415 112/44 mmHg - - 66 17 92 %  09/21/13 1400 106/44 mmHg - - 74 14 98 %  09/21/13 1345 - - - 77 17 100 %  09/21/13 1338 104/50 mmHg 98.4 F (36.9 C) - 76 22 100 %  09/21/13 1030 135/48 mmHg - - 91 19 100 %  09/21/13 1025 143/62 mmHg - - 79 15 100 %  09/21/13 1020 163/55 mmHg - - 85 17 100 %  09/21/13 1015 171/64 mmHg - - 84 16 100 %  09/21/13 1010 168/71 mmHg - - 91 29 100 %  09/21/13 1005 173/67 mmHg - - 89 17 100 %  09/21/13 1000 167/68 mmHg - - 88 21 100 %  09/21/13 0955 169/69 mmHg - - 88 19 100 %  09/21/13 0950 170/69 mmHg - - 86 17 100 %  09/21/13 0945 172/97 mmHg - - 86 15 100 %  09/21/13 0940 176/68 mmHg - - 88 17 100 %  09/21/13 0935 174/68 mmHg - - 86 15 100 %  09/21/13 0930 158/60 mmHg - - 82 20 100 %  09/21/13 0925 173/64 mmHg - - 84 20 100 %  09/21/13 0920 169/63 mmHg - - 85 12 100 %  09/21/13 0915 169/65 mmHg - - 81 23 100 %  09/21/13 0910 - - - 80 14 100 %     Recent laboratory studies:  Recent Labs  09/20/13 0930 09/22/13 0610  WBC 9.4 7.0  HGB 13.7 11.5*  HCT 40.3 33.3*  PLT 259 200  NA 137 139  K 3.8 3.4*  CL 98 102  CO2 28 27  BUN 23 17  CREATININE 0.61 0.64  GLUCOSE 86 89  CALCIUM 9.7 8.5     Discharge Medications:     Medication List    STOP taking these medications       ibuprofen 200 MG tablet  Commonly known as:  ADVIL,MOTRIN      TAKE these medications       aspirin EC 81 MG tablet  Take 81 mg by mouth daily.     atorvastatin 80 MG tablet   Commonly known as:  LIPITOR  Take 80 mg by mouth at bedtime.     BYDUREON 2 MG Pen  Generic drug:  Exenatide ER  Inject 2 mg into the skin every 7 (seven) days. Sundays     CALTRATE 600+D PO  Take 1 tablet by mouth 2 (two) times daily.     chlorthalidone 25 MG tablet  Commonly known as:  HYGROTON  Take 25 mg by mouth every other day.     cholecalciferol 1000 UNITS tablet  Commonly known as:  VITAMIN D  Take 1,000 Units by mouth at bedtime.     cyanocobalamin 1000 MCG/ML injection  Commonly known as:  (VITAMIN B-12)  Inject 1,000 mcg into the muscle every 30 (thirty) days. First of each month     insulin glargine 100 UNIT/ML injection  Commonly known as:  LANTUS  Inject 30 Units into the skin 2 (two) times daily.     INVOKANA 300 MG Tabs  Generic drug:  Canagliflozin  Take 300 mg by mouth daily.     losartan 100 MG tablet  Commonly known as:  COZAAR  Take 100 mg by mouth daily.     metFORMIN 1000 MG tablet  Commonly known as:  GLUCOPHAGE  Take 1,000 mg by mouth 2 (two) times daily with a meal.     multivitamin tablet  Take 1 tablet by mouth daily.     oxyCODONE-acetaminophen 5-325 MG per tablet  Commonly known as:  ROXICET  Take 1-2 tablets by mouth every 4 (four) hours as needed for severe pain.     pioglitazone 30 MG tablet  Commonly known as:  ACTOS  Take 15 mg by mouth daily.        Diagnostic Studies: Dg Chest 2 View  09/20/2013   CLINICAL DATA:  Pre admit right humerus surgery  EXAM: CHEST  2 VIEW  COMPARISON:  03/04/2012  FINDINGS: Streaky density right upper lobe unchanged and consistent with scarring. Negative for heart failure or pneumonia. Negative for mass lesion.  Fracture of the proximal right humerus.  IMPRESSION: Right upper lobe scarring.  No acute abnormality.   Electronically Signed   By: Marlan Palau M.D.   On: 09/20/2013 13:12   Dg Shoulder Right  09/21/2013   CLINICAL DATA:  Fracture fixation.  EXAM: RIGHT SHOULDER - 2+ VIEW; DG  C-ARM  GT 120 MIN  COMPARISON:  CT scan right shoulder 09/06/2013.  FINDINGS: We are provided with 2 fluoroscopic spot views of the right shoulder. Images demonstrate plate and screws and place for fixation of a proximal humerus fracture.  IMPRESSION: As above.   Electronically Signed   By: Drusilla Kanner M.D.   On: 09/21/2013 15:21   Dg Shoulder Right  09/02/2013   CLINICAL DATA:  Fall, pain  EXAM: RIGHT SHOULDER - 2+ VIEW  COMPARISON:  None.  FINDINGS: There is a comminuted fracture involving the surgical neck of the humerus. There are 3 primary fracture fragments involving humeral head, greater tuberosity, and humeral shaft. There is mild angulation at the fracture site. There is moderate displacement of the greater tuberosity fracture fragment.  IMPRESSION: Comminuted fracture of the proximal humerus.   Electronically Signed   By: Esperanza Heir M.D.   On: 09/02/2013 17:33   Ct Head Wo Contrast  09/02/2013   CLINICAL DATA:  Trauma to the front of face and head.  EXAM: CT HEAD WITHOUT CONTRAST  TECHNIQUE: Contiguous axial images were obtained from the base of the skull through the vertex without intravenous contrast.  COMPARISON:  CT head without contrast 03/03/2004  FINDINGS: Fullness at the foramen magnum suggests the possibility of a Chiari malformation. No acute cortical infarct, hemorrhage, or mass lesion is present. The ventricles are of normal size. No significant extra-axial fluid collection is present. The paranasal sinuses and mastoid air cells are clear. No significant extracranial soft tissue injury is evident.  IMPRESSION: 1. Probable Chiari 1 malformation. 2. No acute intracranial abnormality. 3. No acute osseous injury.   Electronically Signed   By: Gennette Pac M.D.   On: 09/02/2013 17:29   Ct Shoulder Right Wo Contrast  09/06/2013   CLINICAL DATA:  Humeral head and neck fractures.  EXAM: CT OF THE RIGHT SHOULDER WITHOUT CONTRAST  TECHNIQUE: Multidetector CT imaging was performed  according to the standard protocol. Multiplanar CT image reconstructions were also generated.  COMPARISON:  Radiographs 09/02/2013.  FINDINGS: There is a complex comminuted fracture of the humeral head and neck. There is a transverse fracture through the humeral neck and a vertical fracture through the greater tuberosity. The lesser tuberosity is intact. There is mild impaction at the humeral neck fracture along with slight posterior rotation of the humeral head. The glenohumeral joint is maintained. [facility] joint is intact. Mild to moderate degenerative changes. Advanced degenerative changes noted at the sternoclavicular joint. No upper rib fractures. The right lung is grossly clear.  IMPRESSION: Complex comminuted fracture of the humeral head and neck as discussed above.   Electronically Signed   By: Loralie Champagne M.D.   On: 09/06/2013 08:27   Dg Humerus Right  09/02/2013   CLINICAL DATA:  Fall, pain  EXAM: RIGHT HUMERUS - 2+ VIEW  COMPARISON:  None.  FINDINGS: There is a comminuted fracture through the surgical neck of the humerus with 3 major fracture fragments, 1 of which including the greater tuberosity of the humerus. There is mild displacement and angulation involving the primary fracture site. Greater tuberosity is moderately displaced.  IMPRESSION: Proximal humeral fracture.   Electronically Signed   By: Esperanza Heir M.D.   On: 09/02/2013 17:32   Dg C-arm Gt 120 Min  09/21/2013   CLINICAL DATA:  Fracture fixation.  EXAM: RIGHT SHOULDER - 2+ VIEW; DG C-ARM GT 120 MIN  COMPARISON:  CT scan right shoulder 09/06/2013.  FINDINGS: We are provided with  2 fluoroscopic spot views of the right shoulder. Images demonstrate plate and screws and place for fixation of a proximal humerus fracture.  IMPRESSION: As above.   Electronically Signed   By: Drusilla Kanner M.D.   On: 09/21/2013 15:21    Disposition: 01-Home or Self Care      Discharge Orders   Future Orders Complete By Expires   Call MD /  Call 911  As directed    Comments:     If you experience chest pain or shortness of breath, CALL 911 and be transported to the hospital emergency room.  If you develope a fever above 101 F, pus (white drainage) or increased drainage or redness at the wound, or calf pain, call your surgeon's office.   Constipation Prevention  As directed    Comments:     Drink plenty of fluids.  Prune juice may be helpful.  You may use a stool softener, such as Colace (over the counter) 100 mg twice a day.  Use MiraLax (over the counter) for constipation as needed.   Diet - low sodium heart healthy  As directed    Driving restrictions  As directed    Comments:     No driving for until cleared by physician.   Increase activity slowly as tolerated  As directed       Follow-up Information   Follow up with Mable Paris, MD. Schedule an appointment as soon as possible for a visit in 2 weeks.   Specialty:  Orthopedic Surgery   Contact information:   [address] [place] [address] [phone]       Follow up with Mable Paris, MD. Schedule an appointment as soon as possible for a visit in 2 weeks.   Specialty:  Orthopedic Surgery   Contact information:   [address] [place] [address] [phone]        Signed: Jiles Harold 09/22/2013, 9:09 AM

## 2013-09-22 NOTE — Progress Notes (Signed)
No NCM needs identified. Alan Riles RN CCM Case Mgmt phone [phone] 

## 2013-09-25 ENCOUNTER — Encounter (HOSPITAL_COMMUNITY): Payer: Self-pay | Admitting: Orthopedic Surgery

## 2013-11-13 ENCOUNTER — Telehealth: Payer: Self-pay

## 2013-11-13 NOTE — Telephone Encounter (Signed)
Pt was referred by Dr. Willey Blade for screening colonoscopy. LMOM for a return call.

## 2013-12-01 ENCOUNTER — Other Ambulatory Visit: Payer: Self-pay

## 2013-12-01 DIAGNOSIS — Z1211 Encounter for screening for malignant neoplasm of colon: Secondary | ICD-10-CM

## 2013-12-01 NOTE — Telephone Encounter (Signed)
Day of prep: invokana 150mg  lantus 15 units BID Metformin 500mg  BID actos 1/2 tab   He should ask prescribing doctor what to do with the Exanatide injection he takes for DM once weekly.

## 2013-12-01 NOTE — Telephone Encounter (Addendum)
Gastroenterology Pre-Procedure Review  Request Date: 12/01/2013 Requesting Physician: Dr. Willey Blade  PT HAD HER LAST COLONOSCOPY IN 08/2003 BY DR. Gala Romney. NEXT RECOMMENDED IN 10 YEARS  PATIENT REVIEW QUESTIONS: The patient responded to the following health history questions as indicated:    1. Diabetes Melitis: yes 2. Joint replacements in the past 12 months: no 3. Major health problems in the past 3 months: SURGERY ON RIGHT SHOULDER IN NOV 2014 FROM A FALL  4. Has an artificial valve or MVP: no 5. Has a defibrillator: no 6. Has been advised in past to take antibiotics in advance of a procedure like teeth cleaning: no    MEDICATIONS & ALLERGIES:    Patient reports the following regarding taking any blood thinners:   Plavix? no Aspirin? yes Coumadin? no  Patient confirms/reports the following medications:  Current Outpatient Prescriptions  Medication Sig Dispense Refill  . aspirin EC 81 MG tablet Take 81 mg by mouth daily.      Marland Kitchen atorvastatin (LIPITOR) 80 MG tablet Take 80 mg by mouth at bedtime.      . Calcium Carbonate-Vitamin D (CALTRATE 600+D PO) Take 1 tablet by mouth daily.       . Canagliflozin (INVOKANA) 300 MG TABS Take 300 mg by mouth daily.      . chlorthalidone (HYGROTON) 25 MG tablet Take 25 mg by mouth every other day.      . cyanocobalamin (,VITAMIN B-12,) 1000 MCG/ML injection Inject 1,000 mcg into the muscle every 30 (thirty) days. First of each month      . Exenatide ER (BYDUREON) 2 MG PEN Inject 2 mg into the skin every 7 (seven) days. Sundays      . insulin glargine (LANTUS) 100 UNIT/ML injection Inject 30 Units into the skin 2 (two) times daily.      Marland Kitchen losartan (COZAAR) 100 MG tablet Take 100 mg by mouth daily.      . metFORMIN (GLUCOPHAGE) 1000 MG tablet Take 1,000 mg by mouth 2 (two) times daily with a meal.      . Multiple Vitamin (MULTIVITAMIN) tablet Take 1 tablet by mouth daily.      . pioglitazone (ACTOS) 30 MG tablet Take 15 mg by mouth daily.       No  current facility-administered medications for this visit.    Patient confirms/reports the following allergies:  No Known Allergies  No orders of the defined types were placed in this encounter.    AUTHORIZATION INFORMATION Primary Insurance:   ID #:   Group #:  Pre-Cert / Auth required: Pre-Cert / Auth #:   Secondary Insurance:  ID #:   Group #:  Pre-Cert / Auth required: Pre-Cert / Auth #:   SCHEDULE INFORMATION: Procedure has been scheduled as follows:  Date:  12/27/2013         Time:  7:30 AM Location: [facility] Short Stay  This Gastroenterology Pre-Precedure Review Form is being routed to the following provider(s): R. Garfield Cornea, MD

## 2013-12-04 MED ORDER — PEG-KCL-NACL-NASULF-NA ASC-C 100 G PO SOLR: 1.0000 | ORAL | Status: DC

## 2013-12-04 NOTE — Telephone Encounter (Signed)
Rx sent to the pharmacy and instructions mailed to pt.  

## 2013-12-15 ENCOUNTER — Encounter (HOSPITAL_COMMUNITY): Payer: Self-pay | Admitting: Pharmacy Technician

## 2013-12-25 ENCOUNTER — Telehealth: Payer: Self-pay

## 2013-12-25 NOTE — Telephone Encounter (Signed)
OK. Then same instructions as before.

## 2013-12-25 NOTE — Telephone Encounter (Signed)
Pt aware.

## 2013-12-25 NOTE — Telephone Encounter (Signed)
Pt called to reschedule her colonoscopy that is scheduled on 12/27/2013 at 7:30 AM with Dr. Gala Romney, to 01/09/2014 at 8:45 Am. I called Maudie Mercury and LMOM. She is aware to be at the hospital at 7:45 AM.  She has not had any new medical problems and no change in her medications.

## 2014-01-09 ENCOUNTER — Encounter (HOSPITAL_COMMUNITY)
Admission: RE | Disposition: A | Discharge status: Home / Self Care | Payer: Self-pay | Source: Ambulatory Visit | Attending: Internal Medicine

## 2014-01-09 ENCOUNTER — Encounter (HOSPITAL_COMMUNITY): Payer: Self-pay

## 2014-01-09 ENCOUNTER — Ambulatory Visit (HOSPITAL_COMMUNITY)
Admission: RE | Admit: 2014-01-09 | Discharge: 2014-01-09 | Disposition: A | Discharge status: Home / Self Care | Payer: BC Managed Care – PPO | Source: Ambulatory Visit | Attending: Internal Medicine | Admitting: Internal Medicine

## 2014-01-09 DIAGNOSIS — Z794 Long term (current) use of insulin: Secondary | ICD-10-CM | POA: Insufficient documentation

## 2014-01-09 DIAGNOSIS — E7800 Pure hypercholesterolemia, unspecified: Secondary | ICD-10-CM | POA: Insufficient documentation

## 2014-01-09 DIAGNOSIS — Z7982 Long term (current) use of aspirin: Secondary | ICD-10-CM | POA: Insufficient documentation

## 2014-01-09 DIAGNOSIS — E119 Type 2 diabetes mellitus without complications: Secondary | ICD-10-CM | POA: Insufficient documentation

## 2014-01-09 DIAGNOSIS — Z1211 Encounter for screening for malignant neoplasm of colon: Secondary | ICD-10-CM | POA: Insufficient documentation

## 2014-01-09 DIAGNOSIS — Z79899 Other long term (current) drug therapy: Secondary | ICD-10-CM | POA: Insufficient documentation

## 2014-01-09 DIAGNOSIS — I10 Essential (primary) hypertension: Secondary | ICD-10-CM | POA: Insufficient documentation

## 2014-01-09 HISTORY — PX: COLONOSCOPY: SHX5424

## 2014-01-09 LAB — GLUCOSE, CAPILLARY: Glucose-Capillary: 85 mg/dL (ref 70–99)

## 2014-01-09 SURGERY — COLONOSCOPY
Anesthesia: Moderate Sedation

## 2014-01-09 MED ORDER — MEPERIDINE HCL 100 MG/ML IJ SOLN
INTRAMUSCULAR | Status: AC
  Filled 2014-01-09: qty 2

## 2014-01-09 MED ORDER — ONDANSETRON HCL 4 MG/2ML IJ SOLN
INTRAMUSCULAR | Status: DC | PRN
  Administered 2014-01-09: 4 mg via INTRAVENOUS

## 2014-01-09 MED ORDER — ONDANSETRON HCL 4 MG/2ML IJ SOLN
INTRAMUSCULAR | Status: AC
  Filled 2014-01-09: qty 2

## 2014-01-09 MED ORDER — MIDAZOLAM HCL 5 MG/5ML IJ SOLN
INTRAMUSCULAR | Status: AC
  Filled 2014-01-09: qty 10

## 2014-01-09 MED ORDER — SODIUM CHLORIDE 0.9 % IV SOLN
INTRAVENOUS | Status: DC
  Administered 2014-01-09: 08:00:00 via INTRAVENOUS

## 2014-01-09 MED ORDER — MIDAZOLAM HCL 5 MG/5ML IJ SOLN
INTRAMUSCULAR | Status: DC | PRN
  Administered 2014-01-09: 2 mg via INTRAVENOUS
  Administered 2014-01-09 (×2): 1 mg via INTRAVENOUS
  Administered 2014-01-09: 2 mg via INTRAVENOUS

## 2014-01-09 MED ORDER — STERILE WATER FOR IRRIGATION IR SOLN
Status: DC | PRN
  Administered 2014-01-09: 09:00:00

## 2014-01-09 MED ORDER — MEPERIDINE HCL 100 MG/ML IJ SOLN
INTRAMUSCULAR | Status: DC | PRN
  Administered 2014-01-09: 50 mg via INTRAVENOUS
  Administered 2014-01-09 (×2): 25 mg via INTRAVENOUS

## 2014-01-09 NOTE — H&P (Signed)
Primary Care Physician:  Asencion Noble, MD Primary Gastroenterologist:  Dr. Gala Romney  Pre-Procedure History & Physical: HPI:  Gabriella Holmes is a 63 y.o. female is here for a screening colonoscopy.  Average risk screening colonoscopy. Negative colonoscopy 10 years ago. No bowel symptoms. No family history of colon cancer.   Past Medical History  Diagnosis Date  . Hypertension   . GERD (gastroesophageal reflux disease)   . High cholesterol   . Type II diabetes mellitus     Past Surgical History  Procedure Laterality Date  . Back surgery    . Trigger finger release Left 1980's  . Cyst excision  1980's    from facial area.   . Orif proximal humerus fracture Right 09/21/2013  . Cholecystectomy  ~ 2012  . Tubal ligation  1975  . Cataract extraction w/ intraocular lens  implant, bilateral Bilateral ~ 2011  . Cardiac catheterization  2001  . Photocoagulation with laser Bilateral ~ 2011-2014    "for diabetic retinopathy" (09/21/2013)  . Lumbar disc surgery  1989  . Eye surgery    . Orif humerus fracture Right 09/21/2013    Procedure: OPEN REDUCTION INTERNAL FIXATION (ORIF) PROXIMAL HUMERUS FRACTURE;  Surgeon: Nita Sells, MD;  Location: [facility] [place];  Service: Orthopedics;  Laterality: Right;  Right open reduction internal fixation humerus fracture    Prior to Admission medications   Medication Sig Start Date End Date Taking? Authorizing Provider  aspirin EC 81 MG tablet Take 81 mg by mouth daily.   Yes Historical Provider, MD  atorvastatin (LIPITOR) 80 MG tablet Take 80 mg by mouth at bedtime.   Yes Historical Provider, MD  Calcium Carbonate-Vitamin D (CALTRATE 600+D PO) Take 1 tablet by mouth daily.    Yes Historical Provider, MD  Canagliflozin (INVOKANA) 300 MG TABS Take 300 mg by mouth daily.   Yes Historical Provider, MD  chlorthalidone (HYGROTON) 25 MG tablet Take 25 mg by mouth every other day.   Yes Historical Provider, MD  cyanocobalamin (,VITAMIN B-12,) 1000 MCG/ML  injection Inject 1,000 mcg into the muscle every 30 (thirty) days. First of each month   Yes Historical Provider, MD  Exenatide ER (BYDUREON) 2 MG PEN Inject 2 mg into the skin every 7 (seven) days. Sundays   Yes Historical Provider, MD  insulin glargine (LANTUS) 100 UNIT/ML injection Inject 30 Units into the skin 2 (two) times daily.   Yes Historical Provider, MD  losartan (COZAAR) 100 MG tablet Take 100 mg by mouth daily.   Yes Historical Provider, MD  metFORMIN (GLUCOPHAGE) 1000 MG tablet Take 1,000 mg by mouth 2 (two) times daily with a meal.   Yes Historical Provider, MD  Multiple Vitamin (MULTIVITAMIN) tablet Take 1 tablet by mouth daily.   Yes Historical Provider, MD  peg 3350 powder (MOVIPREP) 100 G SOLR Take 1 kit (200 g total) by mouth as directed. 12/04/13  Yes Daneil Dolin, MD  pioglitazone (ACTOS) 30 MG tablet Take 15 mg by mouth daily.   Yes Historical Provider, MD    Allergies as of 12/01/2013  . (No Known Allergies)    History reviewed. No pertinent family history.  History   Social History  . Marital Status: Married    Spouse Name: N/A    Number of Children: N/A  . Years of Education: N/A   Occupational History  . Not on file.   Social History Main Topics  . Smoking status: Former Smoker -- 0.50 packs/day for 5 years    Types:  Cigarettes  . Smokeless tobacco: Never Used     Comment: 09/21/2013 "quit smoking cigarettes when I was probably 63 yr old"  . Alcohol Use: Yes     Comment: 09/21/2013 "might have alcohol 1-2X/yr"  . Drug Use: No  . Sexual Activity: Yes   Other Topics Concern  . Not on file   Social History Narrative  . No narrative on file    Review of Systems: See HPI, otherwise negative ROS  Physical Exam: BP 199/89  Pulse 85  Temp(Src) 97.5 F (36.4 C) (Oral)  Resp 24  Ht '5\' 8"'  (1.727 m)  Wt 240 lb (108.863 kg)  BMI 36.50 kg/m2  SpO2 99% General:   Alert,  Well-developed, well-nourished, pleasant and cooperative in NAD Head:   Normocephalic and atraumatic. Eyes:  Sclera clear, no icterus.   Conjunctiva pink. Ears:  Normal auditory acuity. Nose:  No deformity, discharge,  or lesions. Mouth:  No deformity or lesions, dentition normal. Neck:  Supple; no masses or thyromegaly. Lungs:  Clear throughout to auscultation.   No wheezes, crackles, or rhonchi. No acute distress. Heart:  Regular rate and rhythm; no murmurs, clicks, rubs,  or gallops. Abdomen:  Obese. Positive bowel sounds soft and nontender without appreciable mass or organomegaly Msk:  Symmetrical without gross deformities. Normal posture. Pulses:  Normal pulses noted. Extremities:  Without clubbing or edema. Neurologic:  Alert and  oriented x4;  grossly normal neurologically. Skin:  Intact without significant lesions or rashes. Cervical Nodes:  No significant cervical adenopathy. Psych:  Alert and cooperative. Normal mood and affect.  Impression/Plan: Gabriella Holmes is now here to undergo a screening colonoscopy.  Average risk screening examination  Risks, benefits, limitations, imponderables and alternatives regarding colonoscopy have been reviewed with the patient. Questions have been answered. All parties agreeable.

## 2014-01-09 NOTE — Discharge Instructions (Addendum)
Repeat screening colonoscopy in 10 years   Colonoscopy Discharge Instructions  Read the instructions outlined below and refer to this sheet in the next few weeks. These discharge instructions provide you with general information on caring for yourself after you leave the hospital. Your doctor may also give you specific instructions. While your treatment has been planned according to the most current medical practices available, unavoidable complications occasionally occur. If you have any problems or questions after discharge, call Dr. Gala Romney at [phone]. ACTIVITY  You may resume your regular activity, but move at a slower pace for the next 24 hours.   Take frequent rest periods for the next 24 hours.   Walking will help get rid of the air and reduce the bloated feeling in your belly (abdomen).   No driving for 24 hours (because of the medicine (anesthesia) used during the test).    Do not sign any important legal documents or operate any machinery for 24 hours (because of the anesthesia used during the test).  NUTRITION  Drink plenty of fluids.   You may resume your normal diet as instructed by your doctor.   Begin with a light meal and progress to your normal diet. Heavy or fried foods are harder to digest and may make you feel sick to your stomach (nauseated).   Avoid alcoholic beverages for 24 hours or as instructed.  MEDICATIONS  You may resume your normal medications unless your doctor tells you otherwise.  WHAT YOU CAN EXPECT TODAY  Some feelings of bloating in the abdomen.   Passage of more gas than usual.   Spotting of blood in your stool or on the toilet paper.  IF YOU HAD POLYPS REMOVED DURING THE COLONOSCOPY:  No aspirin products for 7 days or as instructed.   No alcohol for 7 days or as instructed.   Eat a soft diet for the next 24 hours.  FINDING OUT THE RESULTS OF YOUR TEST Not all test results are available during your visit. If your test results are not  back during the visit, make an appointment with your caregiver to find out the results. Do not assume everything is normal if you have not heard from your caregiver or the medical facility. It is important for you to follow up on all of your test results.  SEEK IMMEDIATE MEDICAL ATTENTION IF:  You have more than a spotting of blood in your stool.   Your belly is swollen (abdominal distention).   You are nauseated or vomiting.   You have a temperature over 101.   You have abdominal pain or discomfort that is severe or gets worse throughout the day.

## 2014-01-09 NOTE — Op Note (Signed)
[facility] [address] [place], [address]   COLONOSCOPY PROCEDURE REPORT  PATIENT: Gabriella Holmes, Gabriella Holmes.  MR#:         [id number] BIRTHDATE: 26-May-1951 , 62  yrs. old GENDER: Female ENDOSCOPIST: R.  Garfield Cornea, MD FACP FACG REFERRED BY:  Asencion Noble, M.D. PROCEDURE DATE:  01/09/2014 PROCEDURE:     Screening colonoscopy  INDICATIONS: Average risk colorectal cancer screening examination  INFORMED CONSENT:  The risks, benefits, alternatives and imponderables including but not limited to bleeding, perforation as well as the possibility of a missed lesion have been reviewed.  The potential for biopsy, lesion removal, etc. have also been discussed.  Questions have been answered.  All parties agreeable. Please see the history and physical in the medical record for more information.  MEDICATIONS: Versed 6 mg IV and Demerol 100 mg IV in divided doses. Zofran 4 mg IV  DESCRIPTION OF PROCEDURE:  After a digital rectal exam was performed, the EC-3890Li ([id number])  colonoscope was advanced from the anus through the rectum and colon to the area of the cecum, ileocecal valve and appendiceal orifice.  The cecum was deeply intubated.  These structures were well-seen and photographed for the record.  From the level of the cecum and ileocecal valve, the scope was slowly and cautiously withdrawn.  The mucosal surfaces were carefully surveyed utilizing scope tip deflection to facilitate fold flattening as needed.  The scope was pulled down into the rectum where a thorough examination including retroflexion was performed.    FINDINGS:  Adequate preparation. Normal rectum. Normal-appearing colonic mucosa.  THERAPEUTIC / DIAGNOSTIC MANEUVERS PERFORMED:  none  COMPLICATIONS: none  CECAL WITHDRAWAL TIME:  14 minutes  IMPRESSION:  Normal colonoscopy  RECOMMENDATIONS: One more colonoscopy for screening purposes in 10 years   _______________________________ eSigned:  R.  Garfield Cornea, MD FACP [facility] 01/09/2014 9:42 AM   CC:

## 2014-01-12 ENCOUNTER — Encounter (HOSPITAL_COMMUNITY): Payer: Self-pay | Admitting: Internal Medicine

## 2014-06-08 ENCOUNTER — Other Ambulatory Visit: Payer: Self-pay | Admitting: Obstetrics and Gynecology

## 2014-06-13 LAB — CYTOLOGY - PAP

## 2014-07-27 ENCOUNTER — Other Ambulatory Visit: Payer: Self-pay | Admitting: Obstetrics and Gynecology

## 2014-07-31 LAB — CYTOLOGY - PAP

## 2015-06-14 ENCOUNTER — Other Ambulatory Visit: Payer: Self-pay | Admitting: Obstetrics and Gynecology

## 2015-06-17 LAB — CYTOLOGY - PAP

## 2015-06-18 ENCOUNTER — Other Ambulatory Visit: Payer: Self-pay | Admitting: Obstetrics and Gynecology

## 2015-06-18 DIAGNOSIS — R928 Other abnormal and inconclusive findings on diagnostic imaging of breast: Secondary | ICD-10-CM

## 2015-06-21 ENCOUNTER — Ambulatory Visit
Admission: RE | Admit: 2015-06-21 | Discharge: 2015-06-21 | Disposition: A | Discharge status: Home / Self Care | Payer: Medicare Other | Source: Ambulatory Visit | Attending: Obstetrics and Gynecology | Admitting: Obstetrics and Gynecology

## 2015-06-21 DIAGNOSIS — R928 Other abnormal and inconclusive findings on diagnostic imaging of breast: Secondary | ICD-10-CM

## 2015-11-17 IMAGING — MG MM DIAGNOSTIC UNILATERAL L
2 series · 2 of 2 positions shown · non-contrast
Comparison: Previous exam(s).

CLINICAL DATA: Patient recalled from screening for left breast
calcifications.

EXAM:
DIGITAL DIAGNOSTIC LEFT MAMMOGRAM

[L CC]
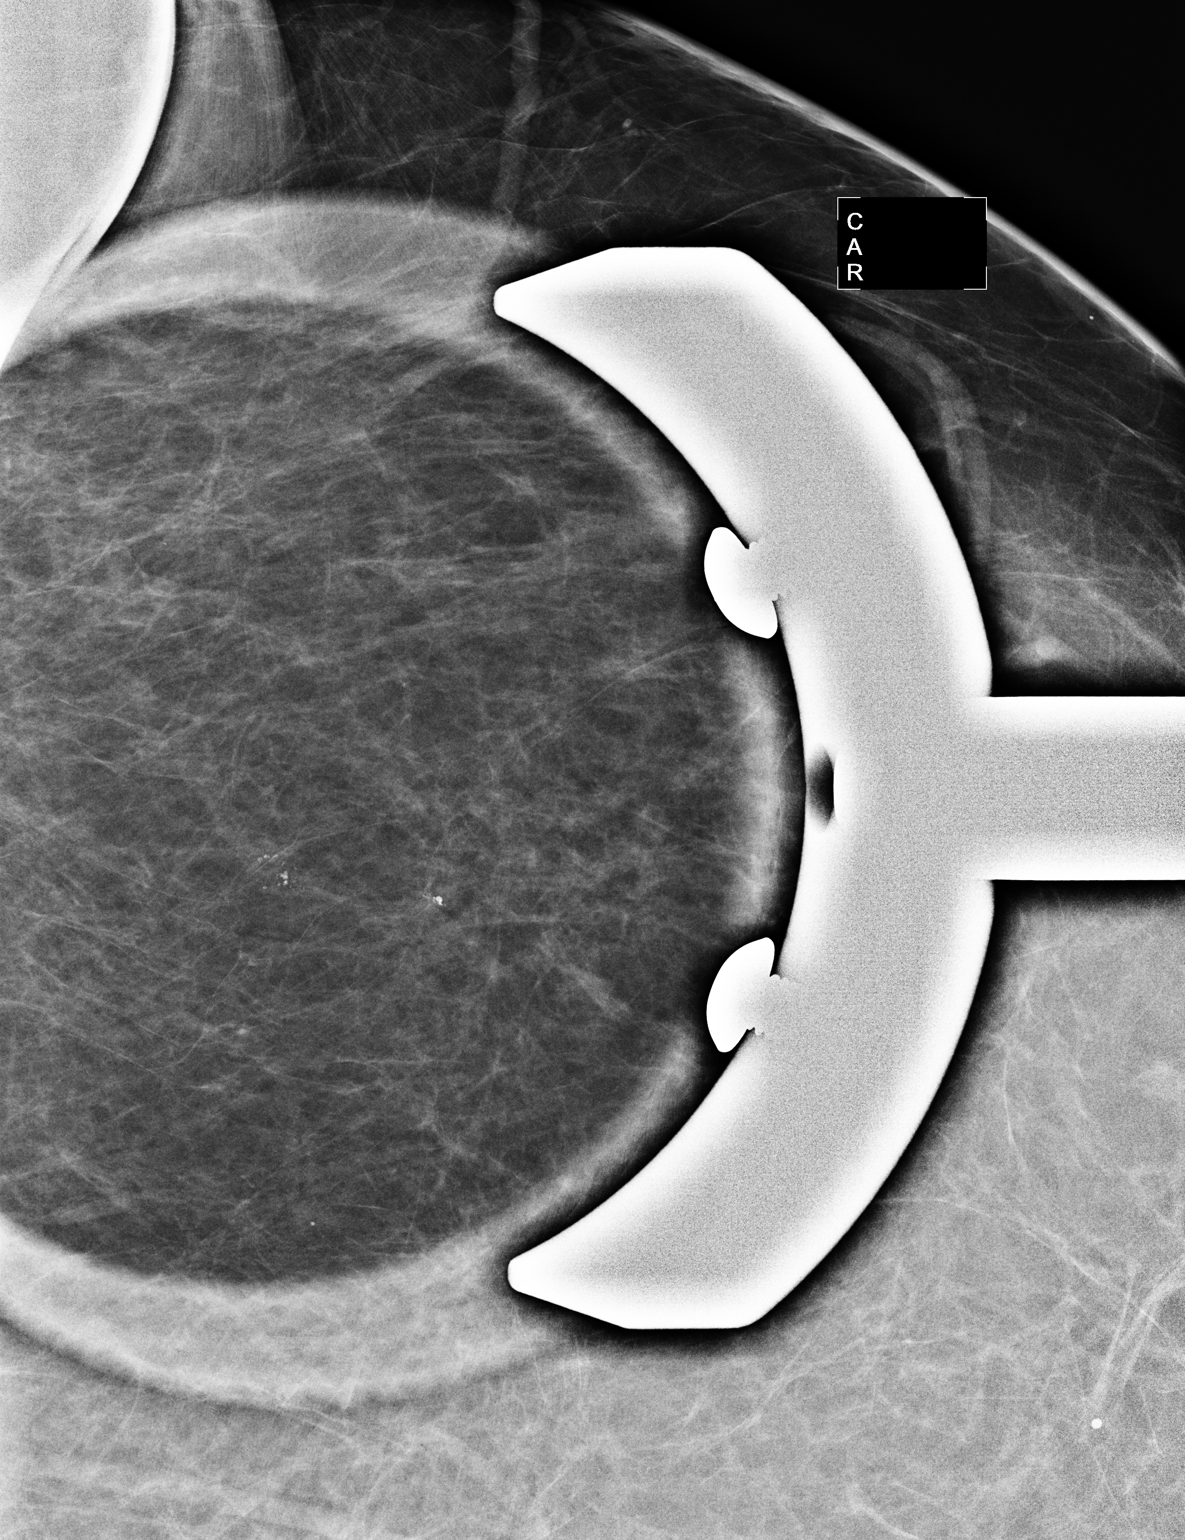

[L ML]
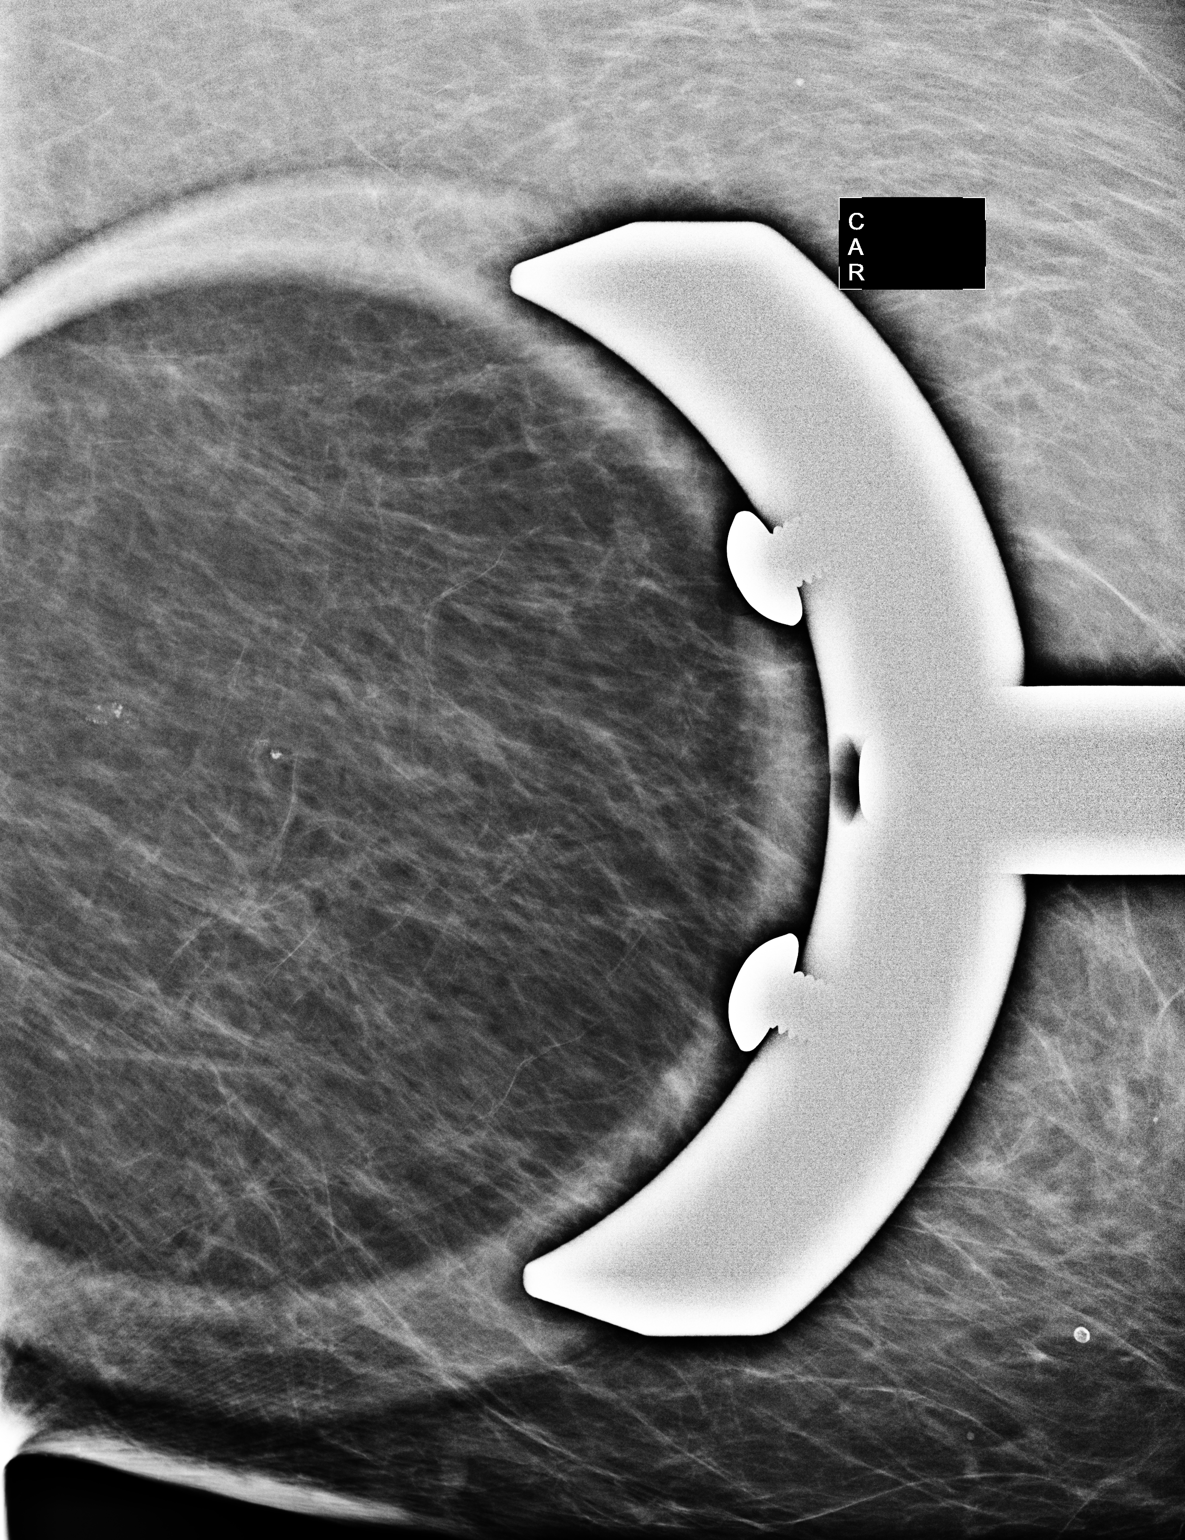

[2 of 2 positions shown; findings below may reference images not displayed]

ACR Breast Density Category b: There are scattered areas of
fibroglandular density.
FINDINGS: Magnification CC and true lateral views of the lateral left breast
demonstrate a small group of coarse calcifications, these are stable
dating back to 8033, compatible with benign etiology.
IMPRESSION: Benign left breast calcifications.

RECOMMENDATION:
Screening mammogram in one year.(Code:[id number])

I have discussed the findings and recommendations with the patient.
Results were also provided in writing at the conclusion of the
visit. If applicable, a reminder letter will be sent to the patient
regarding the next appointment.

BI-RADS CATEGORY  2: Benign.

## 2016-01-02 DIAGNOSIS — Z961 Presence of intraocular lens: Secondary | ICD-10-CM | POA: Diagnosis not present

## 2016-01-02 DIAGNOSIS — H4311 Vitreous hemorrhage, right eye: Secondary | ICD-10-CM | POA: Diagnosis not present

## 2016-01-02 DIAGNOSIS — E113593 Type 2 diabetes mellitus with proliferative diabetic retinopathy without macular edema, bilateral: Secondary | ICD-10-CM | POA: Diagnosis not present

## 2016-01-17 DIAGNOSIS — D513 Other dietary vitamin B12 deficiency anemia: Secondary | ICD-10-CM | POA: Diagnosis not present

## 2016-01-17 DIAGNOSIS — E559 Vitamin D deficiency, unspecified: Secondary | ICD-10-CM | POA: Diagnosis not present

## 2016-01-17 DIAGNOSIS — E1142 Type 2 diabetes mellitus with diabetic polyneuropathy: Secondary | ICD-10-CM | POA: Diagnosis not present

## 2016-01-17 DIAGNOSIS — E7800 Pure hypercholesterolemia, unspecified: Secondary | ICD-10-CM | POA: Diagnosis not present

## 2016-01-28 DIAGNOSIS — D513 Other dietary vitamin B12 deficiency anemia: Secondary | ICD-10-CM | POA: Diagnosis not present

## 2016-01-28 DIAGNOSIS — E1142 Type 2 diabetes mellitus with diabetic polyneuropathy: Secondary | ICD-10-CM | POA: Diagnosis not present

## 2016-01-28 DIAGNOSIS — E113593 Type 2 diabetes mellitus with proliferative diabetic retinopathy without macular edema, bilateral: Secondary | ICD-10-CM | POA: Diagnosis not present

## 2016-01-28 DIAGNOSIS — E559 Vitamin D deficiency, unspecified: Secondary | ICD-10-CM | POA: Diagnosis not present

## 2016-01-28 DIAGNOSIS — E7800 Pure hypercholesterolemia, unspecified: Secondary | ICD-10-CM | POA: Diagnosis not present

## 2016-01-28 DIAGNOSIS — I10 Essential (primary) hypertension: Secondary | ICD-10-CM | POA: Diagnosis not present

## 2016-03-06 DIAGNOSIS — I10 Essential (primary) hypertension: Secondary | ICD-10-CM | POA: Diagnosis not present

## 2016-03-06 DIAGNOSIS — E119 Type 2 diabetes mellitus without complications: Secondary | ICD-10-CM | POA: Diagnosis not present

## 2016-03-06 DIAGNOSIS — E1142 Type 2 diabetes mellitus with diabetic polyneuropathy: Secondary | ICD-10-CM | POA: Diagnosis not present

## 2016-04-03 DIAGNOSIS — Z23 Encounter for immunization: Secondary | ICD-10-CM | POA: Diagnosis not present

## 2016-05-22 DIAGNOSIS — E113593 Type 2 diabetes mellitus with proliferative diabetic retinopathy without macular edema, bilateral: Secondary | ICD-10-CM | POA: Diagnosis not present

## 2016-05-22 DIAGNOSIS — E7800 Pure hypercholesterolemia, unspecified: Secondary | ICD-10-CM | POA: Diagnosis not present

## 2016-05-22 DIAGNOSIS — E1142 Type 2 diabetes mellitus with diabetic polyneuropathy: Secondary | ICD-10-CM | POA: Diagnosis not present

## 2016-05-29 DIAGNOSIS — E7800 Pure hypercholesterolemia, unspecified: Secondary | ICD-10-CM | POA: Diagnosis not present

## 2016-05-29 DIAGNOSIS — D513 Other dietary vitamin B12 deficiency anemia: Secondary | ICD-10-CM | POA: Diagnosis not present

## 2016-05-29 DIAGNOSIS — E113593 Type 2 diabetes mellitus with proliferative diabetic retinopathy without macular edema, bilateral: Secondary | ICD-10-CM | POA: Diagnosis not present

## 2016-05-29 DIAGNOSIS — I10 Essential (primary) hypertension: Secondary | ICD-10-CM | POA: Diagnosis not present

## 2016-05-29 DIAGNOSIS — E1142 Type 2 diabetes mellitus with diabetic polyneuropathy: Secondary | ICD-10-CM | POA: Diagnosis not present

## 2016-05-29 DIAGNOSIS — E559 Vitamin D deficiency, unspecified: Secondary | ICD-10-CM | POA: Diagnosis not present

## 2016-06-12 DIAGNOSIS — E113593 Type 2 diabetes mellitus with proliferative diabetic retinopathy without macular edema, bilateral: Secondary | ICD-10-CM | POA: Diagnosis not present

## 2016-06-12 DIAGNOSIS — E7800 Pure hypercholesterolemia, unspecified: Secondary | ICD-10-CM | POA: Diagnosis not present

## 2016-06-12 DIAGNOSIS — D513 Other dietary vitamin B12 deficiency anemia: Secondary | ICD-10-CM | POA: Diagnosis not present

## 2016-06-12 DIAGNOSIS — E1142 Type 2 diabetes mellitus with diabetic polyneuropathy: Secondary | ICD-10-CM | POA: Diagnosis not present

## 2016-06-12 DIAGNOSIS — I10 Essential (primary) hypertension: Secondary | ICD-10-CM | POA: Diagnosis not present

## 2016-06-12 DIAGNOSIS — E559 Vitamin D deficiency, unspecified: Secondary | ICD-10-CM | POA: Diagnosis not present

## 2016-07-21 DIAGNOSIS — Z01419 Encounter for gynecological examination (general) (routine) without abnormal findings: Secondary | ICD-10-CM | POA: Diagnosis not present

## 2016-07-21 DIAGNOSIS — Z1231 Encounter for screening mammogram for malignant neoplasm of breast: Secondary | ICD-10-CM | POA: Diagnosis not present

## 2016-07-21 DIAGNOSIS — Z6837 Body mass index (BMI) 37.0-37.9, adult: Secondary | ICD-10-CM | POA: Diagnosis not present

## 2016-08-24 DIAGNOSIS — N3000 Acute cystitis without hematuria: Secondary | ICD-10-CM | POA: Diagnosis not present

## 2016-08-24 DIAGNOSIS — N390 Urinary tract infection, site not specified: Secondary | ICD-10-CM | POA: Diagnosis not present

## 2016-09-21 DIAGNOSIS — E1142 Type 2 diabetes mellitus with diabetic polyneuropathy: Secondary | ICD-10-CM | POA: Diagnosis not present

## 2016-09-21 DIAGNOSIS — E7800 Pure hypercholesterolemia, unspecified: Secondary | ICD-10-CM | POA: Diagnosis not present

## 2016-09-22 DIAGNOSIS — M79671 Pain in right foot: Secondary | ICD-10-CM | POA: Diagnosis not present

## 2016-09-22 DIAGNOSIS — S92524A Nondisplaced fracture of medial phalanx of right lesser toe(s), initial encounter for closed fracture: Secondary | ICD-10-CM | POA: Diagnosis not present

## 2016-09-25 DIAGNOSIS — E113593 Type 2 diabetes mellitus with proliferative diabetic retinopathy without macular edema, bilateral: Secondary | ICD-10-CM | POA: Diagnosis not present

## 2016-09-25 DIAGNOSIS — E1142 Type 2 diabetes mellitus with diabetic polyneuropathy: Secondary | ICD-10-CM | POA: Diagnosis not present

## 2016-09-25 DIAGNOSIS — E559 Vitamin D deficiency, unspecified: Secondary | ICD-10-CM | POA: Diagnosis not present

## 2016-09-25 DIAGNOSIS — Z23 Encounter for immunization: Secondary | ICD-10-CM | POA: Diagnosis not present

## 2016-09-25 DIAGNOSIS — E7800 Pure hypercholesterolemia, unspecified: Secondary | ICD-10-CM | POA: Diagnosis not present

## 2016-09-25 DIAGNOSIS — D513 Other dietary vitamin B12 deficiency anemia: Secondary | ICD-10-CM | POA: Diagnosis not present

## 2016-09-25 DIAGNOSIS — I10 Essential (primary) hypertension: Secondary | ICD-10-CM | POA: Diagnosis not present

## 2016-10-13 DIAGNOSIS — H524 Presbyopia: Secondary | ICD-10-CM | POA: Diagnosis not present

## 2016-10-13 DIAGNOSIS — Z794 Long term (current) use of insulin: Secondary | ICD-10-CM | POA: Diagnosis not present

## 2016-10-13 DIAGNOSIS — H5203 Hypermetropia, bilateral: Secondary | ICD-10-CM | POA: Diagnosis not present

## 2016-10-13 DIAGNOSIS — Z961 Presence of intraocular lens: Secondary | ICD-10-CM | POA: Diagnosis not present

## 2016-10-13 DIAGNOSIS — E113593 Type 2 diabetes mellitus with proliferative diabetic retinopathy without macular edema, bilateral: Secondary | ICD-10-CM | POA: Diagnosis not present

## 2016-10-13 DIAGNOSIS — H52203 Unspecified astigmatism, bilateral: Secondary | ICD-10-CM | POA: Diagnosis not present

## 2017-01-20 DIAGNOSIS — E7800 Pure hypercholesterolemia, unspecified: Secondary | ICD-10-CM | POA: Diagnosis not present

## 2017-01-20 DIAGNOSIS — E1142 Type 2 diabetes mellitus with diabetic polyneuropathy: Secondary | ICD-10-CM | POA: Diagnosis not present

## 2017-01-26 DIAGNOSIS — E113593 Type 2 diabetes mellitus with proliferative diabetic retinopathy without macular edema, bilateral: Secondary | ICD-10-CM | POA: Diagnosis not present

## 2017-01-26 DIAGNOSIS — E1165 Type 2 diabetes mellitus with hyperglycemia: Secondary | ICD-10-CM | POA: Insufficient documentation

## 2017-01-26 DIAGNOSIS — E559 Vitamin D deficiency, unspecified: Secondary | ICD-10-CM | POA: Diagnosis not present

## 2017-01-26 DIAGNOSIS — Z794 Long term (current) use of insulin: Secondary | ICD-10-CM | POA: Insufficient documentation

## 2017-01-26 DIAGNOSIS — E538 Deficiency of other specified B group vitamins: Secondary | ICD-10-CM | POA: Insufficient documentation

## 2017-01-26 DIAGNOSIS — E7800 Pure hypercholesterolemia, unspecified: Secondary | ICD-10-CM | POA: Insufficient documentation

## 2017-01-26 DIAGNOSIS — E1142 Type 2 diabetes mellitus with diabetic polyneuropathy: Secondary | ICD-10-CM | POA: Diagnosis not present

## 2017-01-26 DIAGNOSIS — I10 Essential (primary) hypertension: Secondary | ICD-10-CM | POA: Diagnosis not present

## 2017-02-23 DIAGNOSIS — E1149 Type 2 diabetes mellitus with other diabetic neurological complication: Secondary | ICD-10-CM | POA: Diagnosis not present

## 2017-02-23 DIAGNOSIS — I10 Essential (primary) hypertension: Secondary | ICD-10-CM | POA: Diagnosis not present

## 2017-03-04 DIAGNOSIS — Z961 Presence of intraocular lens: Secondary | ICD-10-CM | POA: Diagnosis not present

## 2017-03-04 DIAGNOSIS — H4311 Vitreous hemorrhage, right eye: Secondary | ICD-10-CM | POA: Diagnosis not present

## 2017-03-04 DIAGNOSIS — E113593 Type 2 diabetes mellitus with proliferative diabetic retinopathy without macular edema, bilateral: Secondary | ICD-10-CM | POA: Diagnosis not present

## 2017-05-21 DIAGNOSIS — E538 Deficiency of other specified B group vitamins: Secondary | ICD-10-CM | POA: Diagnosis not present

## 2017-05-21 DIAGNOSIS — E7800 Pure hypercholesterolemia, unspecified: Secondary | ICD-10-CM | POA: Diagnosis not present

## 2017-05-21 DIAGNOSIS — E559 Vitamin D deficiency, unspecified: Secondary | ICD-10-CM | POA: Diagnosis not present

## 2017-05-21 DIAGNOSIS — Z794 Long term (current) use of insulin: Secondary | ICD-10-CM | POA: Diagnosis not present

## 2017-05-21 DIAGNOSIS — E1165 Type 2 diabetes mellitus with hyperglycemia: Secondary | ICD-10-CM | POA: Diagnosis not present

## 2017-05-28 DIAGNOSIS — I10 Essential (primary) hypertension: Secondary | ICD-10-CM | POA: Diagnosis not present

## 2017-05-28 DIAGNOSIS — E1142 Type 2 diabetes mellitus with diabetic polyneuropathy: Secondary | ICD-10-CM | POA: Diagnosis not present

## 2017-05-28 DIAGNOSIS — E7800 Pure hypercholesterolemia, unspecified: Secondary | ICD-10-CM | POA: Diagnosis not present

## 2017-05-28 DIAGNOSIS — E1165 Type 2 diabetes mellitus with hyperglycemia: Secondary | ICD-10-CM | POA: Diagnosis not present

## 2017-05-28 DIAGNOSIS — E538 Deficiency of other specified B group vitamins: Secondary | ICD-10-CM | POA: Diagnosis not present

## 2017-05-28 DIAGNOSIS — E559 Vitamin D deficiency, unspecified: Secondary | ICD-10-CM | POA: Diagnosis not present

## 2017-05-28 DIAGNOSIS — Z833 Family history of diabetes mellitus: Secondary | ICD-10-CM | POA: Diagnosis not present

## 2017-05-28 DIAGNOSIS — Z794 Long term (current) use of insulin: Secondary | ICD-10-CM | POA: Diagnosis not present

## 2017-05-28 DIAGNOSIS — E113593 Type 2 diabetes mellitus with proliferative diabetic retinopathy without macular edema, bilateral: Secondary | ICD-10-CM | POA: Diagnosis not present

## 2017-08-26 DIAGNOSIS — R69 Illness, unspecified: Secondary | ICD-10-CM | POA: Diagnosis not present

## 2017-09-03 DIAGNOSIS — I10 Essential (primary) hypertension: Secondary | ICD-10-CM | POA: Diagnosis not present

## 2017-09-03 DIAGNOSIS — E1149 Type 2 diabetes mellitus with other diabetic neurological complication: Secondary | ICD-10-CM | POA: Diagnosis not present

## 2017-09-03 DIAGNOSIS — E1139 Type 2 diabetes mellitus with other diabetic ophthalmic complication: Secondary | ICD-10-CM | POA: Diagnosis not present

## 2017-09-03 DIAGNOSIS — E785 Hyperlipidemia, unspecified: Secondary | ICD-10-CM | POA: Diagnosis not present

## 2017-09-07 DIAGNOSIS — Z124 Encounter for screening for malignant neoplasm of cervix: Secondary | ICD-10-CM | POA: Diagnosis not present

## 2017-09-07 DIAGNOSIS — Z1231 Encounter for screening mammogram for malignant neoplasm of breast: Secondary | ICD-10-CM | POA: Diagnosis not present

## 2017-09-07 DIAGNOSIS — Z6837 Body mass index (BMI) 37.0-37.9, adult: Secondary | ICD-10-CM | POA: Diagnosis not present

## 2017-09-21 DIAGNOSIS — E7800 Pure hypercholesterolemia, unspecified: Secondary | ICD-10-CM | POA: Diagnosis not present

## 2017-09-21 DIAGNOSIS — E538 Deficiency of other specified B group vitamins: Secondary | ICD-10-CM | POA: Diagnosis not present

## 2017-09-21 DIAGNOSIS — E559 Vitamin D deficiency, unspecified: Secondary | ICD-10-CM | POA: Diagnosis not present

## 2017-09-21 DIAGNOSIS — E1165 Type 2 diabetes mellitus with hyperglycemia: Secondary | ICD-10-CM | POA: Diagnosis not present

## 2017-09-21 DIAGNOSIS — Z794 Long term (current) use of insulin: Secondary | ICD-10-CM | POA: Diagnosis not present

## 2017-09-28 DIAGNOSIS — I10 Essential (primary) hypertension: Secondary | ICD-10-CM | POA: Diagnosis not present

## 2017-09-28 DIAGNOSIS — E538 Deficiency of other specified B group vitamins: Secondary | ICD-10-CM | POA: Diagnosis not present

## 2017-09-28 DIAGNOSIS — Z794 Long term (current) use of insulin: Secondary | ICD-10-CM | POA: Diagnosis not present

## 2017-09-28 DIAGNOSIS — E7800 Pure hypercholesterolemia, unspecified: Secondary | ICD-10-CM | POA: Diagnosis not present

## 2017-09-28 DIAGNOSIS — E1142 Type 2 diabetes mellitus with diabetic polyneuropathy: Secondary | ICD-10-CM | POA: Diagnosis not present

## 2017-09-28 DIAGNOSIS — E559 Vitamin D deficiency, unspecified: Secondary | ICD-10-CM | POA: Diagnosis not present

## 2017-09-28 DIAGNOSIS — E113593 Type 2 diabetes mellitus with proliferative diabetic retinopathy without macular edema, bilateral: Secondary | ICD-10-CM | POA: Diagnosis not present

## 2017-09-28 DIAGNOSIS — E1165 Type 2 diabetes mellitus with hyperglycemia: Secondary | ICD-10-CM | POA: Diagnosis not present

## 2017-10-12 DIAGNOSIS — I10 Essential (primary) hypertension: Secondary | ICD-10-CM | POA: Diagnosis not present

## 2017-10-12 DIAGNOSIS — E538 Deficiency of other specified B group vitamins: Secondary | ICD-10-CM | POA: Diagnosis not present

## 2017-10-12 DIAGNOSIS — E559 Vitamin D deficiency, unspecified: Secondary | ICD-10-CM | POA: Diagnosis not present

## 2017-10-12 DIAGNOSIS — E1165 Type 2 diabetes mellitus with hyperglycemia: Secondary | ICD-10-CM | POA: Diagnosis not present

## 2017-10-12 DIAGNOSIS — E113593 Type 2 diabetes mellitus with proliferative diabetic retinopathy without macular edema, bilateral: Secondary | ICD-10-CM | POA: Diagnosis not present

## 2017-10-12 DIAGNOSIS — E1142 Type 2 diabetes mellitus with diabetic polyneuropathy: Secondary | ICD-10-CM | POA: Diagnosis not present

## 2017-10-12 DIAGNOSIS — Z794 Long term (current) use of insulin: Secondary | ICD-10-CM | POA: Diagnosis not present

## 2017-10-12 DIAGNOSIS — E7800 Pure hypercholesterolemia, unspecified: Secondary | ICD-10-CM | POA: Diagnosis not present

## 2017-11-19 DIAGNOSIS — Z961 Presence of intraocular lens: Secondary | ICD-10-CM | POA: Diagnosis not present

## 2017-11-19 DIAGNOSIS — H524 Presbyopia: Secondary | ICD-10-CM | POA: Diagnosis not present

## 2017-11-19 DIAGNOSIS — H52203 Unspecified astigmatism, bilateral: Secondary | ICD-10-CM | POA: Diagnosis not present

## 2017-11-19 DIAGNOSIS — Z794 Long term (current) use of insulin: Secondary | ICD-10-CM | POA: Diagnosis not present

## 2017-11-19 DIAGNOSIS — E119 Type 2 diabetes mellitus without complications: Secondary | ICD-10-CM | POA: Diagnosis not present

## 2017-11-19 DIAGNOSIS — H5203 Hypermetropia, bilateral: Secondary | ICD-10-CM | POA: Diagnosis not present

## 2018-01-04 DIAGNOSIS — E538 Deficiency of other specified B group vitamins: Secondary | ICD-10-CM | POA: Diagnosis not present

## 2018-01-04 DIAGNOSIS — E1165 Type 2 diabetes mellitus with hyperglycemia: Secondary | ICD-10-CM | POA: Diagnosis not present

## 2018-01-04 DIAGNOSIS — Z794 Long term (current) use of insulin: Secondary | ICD-10-CM | POA: Diagnosis not present

## 2018-01-04 DIAGNOSIS — E7800 Pure hypercholesterolemia, unspecified: Secondary | ICD-10-CM | POA: Diagnosis not present

## 2018-01-04 DIAGNOSIS — E559 Vitamin D deficiency, unspecified: Secondary | ICD-10-CM | POA: Diagnosis not present

## 2018-01-18 DIAGNOSIS — E1165 Type 2 diabetes mellitus with hyperglycemia: Secondary | ICD-10-CM | POA: Diagnosis not present

## 2018-01-18 DIAGNOSIS — Z6837 Body mass index (BMI) 37.0-37.9, adult: Secondary | ICD-10-CM | POA: Diagnosis not present

## 2018-01-18 DIAGNOSIS — E669 Obesity, unspecified: Secondary | ICD-10-CM | POA: Diagnosis not present

## 2018-01-18 DIAGNOSIS — E113593 Type 2 diabetes mellitus with proliferative diabetic retinopathy without macular edema, bilateral: Secondary | ICD-10-CM | POA: Diagnosis not present

## 2018-01-18 DIAGNOSIS — Z794 Long term (current) use of insulin: Secondary | ICD-10-CM | POA: Diagnosis not present

## 2018-01-18 DIAGNOSIS — E538 Deficiency of other specified B group vitamins: Secondary | ICD-10-CM | POA: Diagnosis not present

## 2018-01-18 DIAGNOSIS — E7800 Pure hypercholesterolemia, unspecified: Secondary | ICD-10-CM | POA: Diagnosis not present

## 2018-01-18 DIAGNOSIS — E559 Vitamin D deficiency, unspecified: Secondary | ICD-10-CM | POA: Diagnosis not present

## 2018-01-18 DIAGNOSIS — I10 Essential (primary) hypertension: Secondary | ICD-10-CM | POA: Diagnosis not present

## 2018-01-18 DIAGNOSIS — E1142 Type 2 diabetes mellitus with diabetic polyneuropathy: Secondary | ICD-10-CM | POA: Diagnosis not present

## 2018-03-04 DIAGNOSIS — E1140 Type 2 diabetes mellitus with diabetic neuropathy, unspecified: Secondary | ICD-10-CM | POA: Diagnosis not present

## 2018-03-04 DIAGNOSIS — Z6833 Body mass index (BMI) 33.0-33.9, adult: Secondary | ICD-10-CM | POA: Diagnosis not present

## 2018-03-04 DIAGNOSIS — E1149 Type 2 diabetes mellitus with other diabetic neurological complication: Secondary | ICD-10-CM | POA: Diagnosis not present

## 2018-03-04 DIAGNOSIS — Z23 Encounter for immunization: Secondary | ICD-10-CM | POA: Diagnosis not present

## 2018-04-22 DIAGNOSIS — E7800 Pure hypercholesterolemia, unspecified: Secondary | ICD-10-CM | POA: Diagnosis not present

## 2018-04-22 DIAGNOSIS — Z794 Long term (current) use of insulin: Secondary | ICD-10-CM | POA: Diagnosis not present

## 2018-04-22 DIAGNOSIS — E1165 Type 2 diabetes mellitus with hyperglycemia: Secondary | ICD-10-CM | POA: Diagnosis not present

## 2018-05-03 DIAGNOSIS — J069 Acute upper respiratory infection, unspecified: Secondary | ICD-10-CM | POA: Diagnosis not present

## 2018-05-06 DIAGNOSIS — E538 Deficiency of other specified B group vitamins: Secondary | ICD-10-CM | POA: Diagnosis not present

## 2018-05-06 DIAGNOSIS — E1142 Type 2 diabetes mellitus with diabetic polyneuropathy: Secondary | ICD-10-CM | POA: Diagnosis not present

## 2018-05-06 DIAGNOSIS — E559 Vitamin D deficiency, unspecified: Secondary | ICD-10-CM | POA: Diagnosis not present

## 2018-05-06 DIAGNOSIS — Z7982 Long term (current) use of aspirin: Secondary | ICD-10-CM | POA: Diagnosis not present

## 2018-05-06 DIAGNOSIS — I10 Essential (primary) hypertension: Secondary | ICD-10-CM | POA: Diagnosis not present

## 2018-05-06 DIAGNOSIS — Z794 Long term (current) use of insulin: Secondary | ICD-10-CM | POA: Diagnosis not present

## 2018-05-06 DIAGNOSIS — Z87891 Personal history of nicotine dependence: Secondary | ICD-10-CM | POA: Diagnosis not present

## 2018-05-06 DIAGNOSIS — E1165 Type 2 diabetes mellitus with hyperglycemia: Secondary | ICD-10-CM | POA: Diagnosis not present

## 2018-05-06 DIAGNOSIS — E7800 Pure hypercholesterolemia, unspecified: Secondary | ICD-10-CM | POA: Diagnosis not present

## 2018-05-06 DIAGNOSIS — E113593 Type 2 diabetes mellitus with proliferative diabetic retinopathy without macular edema, bilateral: Secondary | ICD-10-CM | POA: Diagnosis not present

## 2018-07-26 DIAGNOSIS — D485 Neoplasm of uncertain behavior of skin: Secondary | ICD-10-CM | POA: Diagnosis not present

## 2018-07-26 DIAGNOSIS — L308 Other specified dermatitis: Secondary | ICD-10-CM | POA: Diagnosis not present

## 2018-07-26 DIAGNOSIS — Z1283 Encounter for screening for malignant neoplasm of skin: Secondary | ICD-10-CM | POA: Diagnosis not present

## 2018-07-26 DIAGNOSIS — D2271 Melanocytic nevi of right lower limb, including hip: Secondary | ICD-10-CM | POA: Diagnosis not present

## 2018-07-26 DIAGNOSIS — D225 Melanocytic nevi of trunk: Secondary | ICD-10-CM | POA: Diagnosis not present

## 2018-08-02 DIAGNOSIS — L988 Other specified disorders of the skin and subcutaneous tissue: Secondary | ICD-10-CM | POA: Diagnosis not present

## 2018-08-02 DIAGNOSIS — D485 Neoplasm of uncertain behavior of skin: Secondary | ICD-10-CM | POA: Diagnosis not present

## 2018-08-12 DIAGNOSIS — E7800 Pure hypercholesterolemia, unspecified: Secondary | ICD-10-CM | POA: Diagnosis not present

## 2018-08-12 DIAGNOSIS — Z794 Long term (current) use of insulin: Secondary | ICD-10-CM | POA: Diagnosis not present

## 2018-08-12 DIAGNOSIS — E1165 Type 2 diabetes mellitus with hyperglycemia: Secondary | ICD-10-CM | POA: Diagnosis not present

## 2018-08-12 DIAGNOSIS — Z23 Encounter for immunization: Secondary | ICD-10-CM | POA: Diagnosis not present

## 2018-08-26 DIAGNOSIS — E1142 Type 2 diabetes mellitus with diabetic polyneuropathy: Secondary | ICD-10-CM | POA: Diagnosis not present

## 2018-08-26 DIAGNOSIS — E1165 Type 2 diabetes mellitus with hyperglycemia: Secondary | ICD-10-CM | POA: Diagnosis not present

## 2018-08-26 DIAGNOSIS — E559 Vitamin D deficiency, unspecified: Secondary | ICD-10-CM | POA: Diagnosis not present

## 2018-08-26 DIAGNOSIS — E113593 Type 2 diabetes mellitus with proliferative diabetic retinopathy without macular edema, bilateral: Secondary | ICD-10-CM | POA: Diagnosis not present

## 2018-08-26 DIAGNOSIS — Z794 Long term (current) use of insulin: Secondary | ICD-10-CM | POA: Diagnosis not present

## 2018-08-26 DIAGNOSIS — E538 Deficiency of other specified B group vitamins: Secondary | ICD-10-CM | POA: Diagnosis not present

## 2018-08-26 DIAGNOSIS — I10 Essential (primary) hypertension: Secondary | ICD-10-CM | POA: Diagnosis not present

## 2018-08-26 DIAGNOSIS — E7800 Pure hypercholesterolemia, unspecified: Secondary | ICD-10-CM | POA: Diagnosis not present

## 2018-09-27 DIAGNOSIS — Z01419 Encounter for gynecological examination (general) (routine) without abnormal findings: Secondary | ICD-10-CM | POA: Diagnosis not present

## 2018-09-27 DIAGNOSIS — Z6838 Body mass index (BMI) 38.0-38.9, adult: Secondary | ICD-10-CM | POA: Diagnosis not present

## 2018-09-27 DIAGNOSIS — Z1231 Encounter for screening mammogram for malignant neoplasm of breast: Secondary | ICD-10-CM | POA: Diagnosis not present

## 2018-10-11 DIAGNOSIS — I10 Essential (primary) hypertension: Secondary | ICD-10-CM | POA: Diagnosis not present

## 2018-10-11 DIAGNOSIS — E1140 Type 2 diabetes mellitus with diabetic neuropathy, unspecified: Secondary | ICD-10-CM | POA: Diagnosis not present

## 2018-11-18 DIAGNOSIS — Z794 Long term (current) use of insulin: Secondary | ICD-10-CM | POA: Diagnosis not present

## 2018-11-18 DIAGNOSIS — E1165 Type 2 diabetes mellitus with hyperglycemia: Secondary | ICD-10-CM | POA: Diagnosis not present

## 2018-11-18 DIAGNOSIS — E7800 Pure hypercholesterolemia, unspecified: Secondary | ICD-10-CM | POA: Diagnosis not present

## 2018-11-22 DIAGNOSIS — H524 Presbyopia: Secondary | ICD-10-CM | POA: Diagnosis not present

## 2018-11-22 DIAGNOSIS — E119 Type 2 diabetes mellitus without complications: Secondary | ICD-10-CM | POA: Diagnosis not present

## 2018-11-22 DIAGNOSIS — Z961 Presence of intraocular lens: Secondary | ICD-10-CM | POA: Diagnosis not present

## 2018-11-22 DIAGNOSIS — M8588 Other specified disorders of bone density and structure, other site: Secondary | ICD-10-CM | POA: Diagnosis not present

## 2018-11-22 DIAGNOSIS — Z794 Long term (current) use of insulin: Secondary | ICD-10-CM | POA: Diagnosis not present

## 2018-11-22 DIAGNOSIS — H5203 Hypermetropia, bilateral: Secondary | ICD-10-CM | POA: Diagnosis not present

## 2018-11-22 DIAGNOSIS — N958 Other specified menopausal and perimenopausal disorders: Secondary | ICD-10-CM | POA: Diagnosis not present

## 2018-11-22 DIAGNOSIS — H52203 Unspecified astigmatism, bilateral: Secondary | ICD-10-CM | POA: Diagnosis not present

## 2018-12-02 DIAGNOSIS — E7800 Pure hypercholesterolemia, unspecified: Secondary | ICD-10-CM | POA: Diagnosis not present

## 2018-12-02 DIAGNOSIS — E559 Vitamin D deficiency, unspecified: Secondary | ICD-10-CM | POA: Diagnosis not present

## 2018-12-02 DIAGNOSIS — Z794 Long term (current) use of insulin: Secondary | ICD-10-CM | POA: Diagnosis not present

## 2018-12-02 DIAGNOSIS — E538 Deficiency of other specified B group vitamins: Secondary | ICD-10-CM | POA: Diagnosis not present

## 2018-12-02 DIAGNOSIS — E1142 Type 2 diabetes mellitus with diabetic polyneuropathy: Secondary | ICD-10-CM | POA: Diagnosis not present

## 2018-12-02 DIAGNOSIS — E113593 Type 2 diabetes mellitus with proliferative diabetic retinopathy without macular edema, bilateral: Secondary | ICD-10-CM | POA: Diagnosis not present

## 2018-12-02 DIAGNOSIS — I10 Essential (primary) hypertension: Secondary | ICD-10-CM | POA: Diagnosis not present

## 2018-12-02 DIAGNOSIS — E1165 Type 2 diabetes mellitus with hyperglycemia: Secondary | ICD-10-CM | POA: Diagnosis not present

## 2018-12-27 DIAGNOSIS — M8580 Other specified disorders of bone density and structure, unspecified site: Secondary | ICD-10-CM | POA: Diagnosis not present

## 2019-01-09 DIAGNOSIS — J111 Influenza due to unidentified influenza virus with other respiratory manifestations: Secondary | ICD-10-CM | POA: Diagnosis not present

## 2019-04-11 DIAGNOSIS — I10 Essential (primary) hypertension: Secondary | ICD-10-CM | POA: Diagnosis not present

## 2019-04-11 DIAGNOSIS — E1140 Type 2 diabetes mellitus with diabetic neuropathy, unspecified: Secondary | ICD-10-CM | POA: Diagnosis not present

## 2019-04-11 DIAGNOSIS — R69 Illness, unspecified: Secondary | ICD-10-CM | POA: Diagnosis not present

## 2019-04-13 ENCOUNTER — Ambulatory Visit (INDEPENDENT_AMBULATORY_CARE_PROVIDER_SITE_OTHER): Payer: Medicare HMO | Admitting: Orthopaedic Surgery

## 2019-04-13 ENCOUNTER — Other Ambulatory Visit: Payer: Self-pay

## 2019-04-13 ENCOUNTER — Encounter: Payer: Self-pay | Admitting: Orthopaedic Surgery

## 2019-04-13 ENCOUNTER — Ambulatory Visit (INDEPENDENT_AMBULATORY_CARE_PROVIDER_SITE_OTHER): Payer: Medicare HMO

## 2019-04-13 VITALS — BP 131/74 | HR 73 | Temp 97.2°F | Ht 68.0 in | Wt 248.0 lb

## 2019-04-13 DIAGNOSIS — M25571 Pain in right ankle and joints of right foot: Secondary | ICD-10-CM

## 2019-04-13 DIAGNOSIS — S92351A Displaced fracture of fifth metatarsal bone, right foot, initial encounter for closed fracture: Secondary | ICD-10-CM | POA: Diagnosis not present

## 2019-04-13 NOTE — Progress Notes (Signed)
Subjective:    Patient ID: Gabriella Holmes, female    DOB: 16-Aug-1951, 68 y.o.   MRN: [id number]  HPI She has had pain in the right foot for about two weeks. She felt a pop when walking and has pain and swelling since then. She has tried rest, ice, elevation and the pain remains. She had a stress fracture in this foot years ago.  She says the pain is the same. She brought in her old CAM walker in case it was needed.   Review of Systems  Constitutional: Positive for activity change.  Musculoskeletal: Positive for arthralgias, gait problem and joint swelling.  All other systems reviewed and are negative.  For Review of Systems, all other systems reviewed and are negative.  The following is a summary of the past history medically, past history surgically, known current medicines, social history and family history.  This information is gathered electronically by the computer from prior information and documentation.  I review this each visit and have found including this information at this point in the chart is beneficial and informative.   Past Medical History:  Diagnosis Date  . GERD (gastroesophageal reflux disease)   . High cholesterol   . Hypertension   . Type II diabetes mellitus ([place])     Past Surgical History:  Procedure Laterality Date  . BACK SURGERY    . CARDIAC CATHETERIZATION  2001  . CATARACT EXTRACTION W/ INTRAOCULAR LENS  IMPLANT, BILATERAL Bilateral ~ 2011  . CHOLECYSTECTOMY  ~ 2012  . COLONOSCOPY N/A 01/09/2014   Procedure: COLONOSCOPY;  Surgeon: Daneil Dolin, MD;  [facility];  Service: Endoscopy;  Laterality: N/A;  7:30 AM-rescheduled to [place] notified pt  . CYST EXCISION  1980's   from facial area.   Marland Kitchen EYE SURGERY    . [place]  . ORIF HUMERUS FRACTURE Right 09/21/2013   Procedure: OPEN REDUCTION INTERNAL FIXATION (ORIF) PROXIMAL HUMERUS FRACTURE;  Surgeon: Nita Sells, MD;  Location: [place];  Service: Orthopedics;   Laterality: Right;  Right open reduction internal fixation humerus fracture  . ORIF PROXIMAL HUMERUS FRACTURE Right 09/21/2013  . PHOTOCOAGULATION WITH LASER Bilateral ~ 2011-2014   "for diabetic retinopathy" (09/21/2013)  . TRIGGER FINGER RELEASE Left 1980's  . TUBAL LIGATION  1975    Current Outpatient Medications on File Prior to Visit  Medication Sig Dispense Refill  . aspirin EC 81 MG tablet Take 81 mg by mouth daily.    Marland Kitchen atorvastatin (LIPITOR) 80 MG tablet Take 80 mg by mouth at bedtime.    . Calcium Carbonate-Vitamin D (CALTRATE 600+D PO) Take 1 tablet by mouth daily.     . chlorthalidone (HYGROTON) 25 MG tablet Take 25 mg by mouth every other day.    . cyanocobalamin (,VITAMIN B-12,) 1000 MCG/ML injection Inject 1,000 mcg into the muscle every 30 (thirty) days. First of each month    . Dulaglutide (TRULICITY) 1.5 AY/3.0ZS SOPN Trulicity 1.5 WF/0.9 mL subcutaneous pen injector    . empagliflozin (JARDIANCE) 25 MG TABS tablet Jardiance 25 mg tablet    . Insulin Degludec (TRESIBA) 100 UNIT/ML SOLN Inject into the skin.    Marland Kitchen losartan (COZAAR) 100 MG tablet Take 100 mg by mouth daily.    . metFORMIN (GLUCOPHAGE) 1000 MG tablet Take 1,000 mg by mouth 2 (two) times daily with a meal.    . Multiple Vitamin (MULTIVITAMIN) tablet Take 1 tablet by mouth daily.    . pioglitazone (  ACTOS) 30 MG tablet Take 15 mg by mouth daily.    . Canagliflozin (INVOKANA) 300 MG TABS Take 300 mg by mouth daily.     No current facility-administered medications on file prior to visit.     Social History   Socioeconomic History  . Marital status: Married    Spouse name: Not on file  . Number of children: Not on file  . Years of education: Not on file  . Highest education level: Not on file  Occupational History  . Not on file  Social Needs  . Financial resource strain: Not on file  . Food insecurity:    Worry: Not on file    Inability: Not on file  . Transportation needs:    Medical: Not on file     Non-medical: Not on file  Tobacco Use  . Smoking status: Former Smoker    Packs/day: 0.50    Years: 5.00    Pack years: 2.50    Types: Cigarettes  . Smokeless tobacco: Never Used  . Tobacco comment: 09/21/2013 "quit smoking cigarettes when I was probably 68 yr old"  Substance and Sexual Activity  . Alcohol use: Yes    Comment: 09/21/2013 "might have alcohol 1-2X/yr"  . Drug use: No  . Sexual activity: Yes  Lifestyle  . Physical activity:    Days per week: Not on file    Minutes per session: Not on file  . Stress: Not on file  Relationships  . Social connections:    Talks on phone: Not on file    Gets together: Not on file    Attends religious service: Not on file    Active member of club or organization: Not on file    Attends meetings of clubs or organizations: Not on file    Relationship status: Not on file  . Intimate partner violence:    Fear of current or ex partner: Not on file    Emotionally abused: Not on file    Physically abused: Not on file    Forced sexual activity: Not on file  Other Topics Concern  . Not on file  Social History Narrative  . Not on file    History reviewed. No pertinent family history.  BP 131/74   Pulse 73   Temp (!) 97.2 F (36.2 C)   Ht 5\' 8"  (1.727 m)   Wt 248 lb (112.5 kg)   BMI 37.71 kg/m   Body mass index is 37.71 kg/m.      Objective:   Physical Exam Vitals signs reviewed.  Constitutional:      Appearance: She is well-developed.  HENT:     Head: Normocephalic and atraumatic.  Eyes:     Conjunctiva/sclera: Conjunctivae normal.     Pupils: Pupils are equal, round, and reactive to light.  Neck:     Musculoskeletal: Normal range of motion and neck supple.  Cardiovascular:     Rate and Rhythm: Normal rate and regular rhythm.  Pulmonary:     Effort: Pulmonary effort is normal.  Abdominal:     Palpations: Abdomen is soft.  Musculoskeletal:     Right foot: Decreased range of motion. Tenderness, bony tenderness  and swelling present.       Feet:  Skin:    General: Skin is warm and dry.  Neurological:     Mental Status: She is alert and oriented to person, place, and time.     Cranial Nerves: No cranial nerve deficit.  Motor: No abnormal muscle tone.     Coordination: Coordination normal.     Deep Tendon Reflexes: Reflexes are normal and symmetric. Reflexes normal.  Psychiatric:        Behavior: Behavior normal.        Thought Content: Thought content normal.        Judgment: Judgment normal.      X-rays were done of the right foot, reported separately.  Acute fracture fifth metatarsal.     Assessment & Plan:  . Encounter Diagnoses  Name Primary?  . Pain in joint involving right ankle and foot Yes  . Closed fracture of base of fifth metatarsal bone of right foot, initial encounter    I have explained the findings of the x-rays.  She has her own CAM walker which was applied.  Return in two weeks.  X-rays then of the right foot.  Call if any problem.  Precautions discussed.   Electronically Signed Sanjuana Kava, MD 6/4/202010:23 AM

## 2019-04-27 ENCOUNTER — Other Ambulatory Visit: Payer: Self-pay

## 2019-04-27 ENCOUNTER — Encounter: Payer: Self-pay | Admitting: Orthopaedic Surgery

## 2019-04-27 ENCOUNTER — Ambulatory Visit (INDEPENDENT_AMBULATORY_CARE_PROVIDER_SITE_OTHER): Payer: Medicare HMO

## 2019-04-27 ENCOUNTER — Ambulatory Visit (INDEPENDENT_AMBULATORY_CARE_PROVIDER_SITE_OTHER): Payer: Medicare HMO | Admitting: Orthopaedic Surgery

## 2019-04-27 VITALS — Temp 97.2°F

## 2019-04-27 DIAGNOSIS — S92351D Displaced fracture of fifth metatarsal bone, right foot, subsequent encounter for fracture with routine healing: Secondary | ICD-10-CM

## 2019-04-27 NOTE — Progress Notes (Signed)
CC:  My foot is less painful.  My last note said she hurt it two weeks before being seen here, it was actually two days.  She has much less pain of the right foot. She is walking with CAM walker, NV intact.  She has no new trauma.  X-rays were done of the right foot, reported separately.  Encounter Diagnosis  Name Primary?  . Closed fracture of base of fifth metatarsal bone of right foot with routine healing Yes   Return in three weeks.   X-rays then.  Call if any problem.  Precautions discussed.   Electronically Signed Sanjuana Kava, MD 6/18/20209:26 AM

## 2019-05-11 ENCOUNTER — Other Ambulatory Visit: Payer: Self-pay

## 2019-05-11 DIAGNOSIS — Z20822 Contact with and (suspected) exposure to covid-19: Secondary | ICD-10-CM

## 2019-05-17 LAB — NOVEL CORONAVIRUS, NAA: SARS-CoV-2, NAA: NOT DETECTED

## 2019-05-18 ENCOUNTER — Encounter: Payer: Self-pay | Admitting: Orthopaedic Surgery

## 2019-05-18 ENCOUNTER — Other Ambulatory Visit: Payer: Self-pay

## 2019-05-18 ENCOUNTER — Ambulatory Visit (INDEPENDENT_AMBULATORY_CARE_PROVIDER_SITE_OTHER): Payer: Medicare HMO

## 2019-05-18 ENCOUNTER — Ambulatory Visit (INDEPENDENT_AMBULATORY_CARE_PROVIDER_SITE_OTHER): Payer: Medicare HMO | Admitting: Orthopaedic Surgery

## 2019-05-18 VITALS — Temp 97.2°F

## 2019-05-18 DIAGNOSIS — S92351D Displaced fracture of fifth metatarsal bone, right foot, subsequent encounter for fracture with routine healing: Secondary | ICD-10-CM | POA: Diagnosis not present

## 2019-05-18 NOTE — Progress Notes (Signed)
CC:  My foot is so much better  She has very little pain of the right foot. She is wearing sandals today. She has no  Pain.  X-rays were done of the right foot, reported separately.  Encounter Diagnosis  Name Primary?  . Closed fracture of base of fifth metatarsal bone of right foot with routine healing Yes   I have told her there is little callus.    I want to see her in one month.  X-rays then of the right foot.  Call if any problem.  Precautions discussed.   Electronically Signed Sanjuana Kava, MD 7/9/20209:02 AM

## 2019-05-19 DIAGNOSIS — E7800 Pure hypercholesterolemia, unspecified: Secondary | ICD-10-CM | POA: Diagnosis not present

## 2019-05-19 DIAGNOSIS — E538 Deficiency of other specified B group vitamins: Secondary | ICD-10-CM | POA: Diagnosis not present

## 2019-05-19 DIAGNOSIS — E1165 Type 2 diabetes mellitus with hyperglycemia: Secondary | ICD-10-CM | POA: Diagnosis not present

## 2019-05-19 DIAGNOSIS — E559 Vitamin D deficiency, unspecified: Secondary | ICD-10-CM | POA: Diagnosis not present

## 2019-05-19 DIAGNOSIS — Z794 Long term (current) use of insulin: Secondary | ICD-10-CM | POA: Diagnosis not present

## 2019-06-02 DIAGNOSIS — E559 Vitamin D deficiency, unspecified: Secondary | ICD-10-CM | POA: Diagnosis not present

## 2019-06-02 DIAGNOSIS — E113593 Type 2 diabetes mellitus with proliferative diabetic retinopathy without macular edema, bilateral: Secondary | ICD-10-CM | POA: Diagnosis not present

## 2019-06-02 DIAGNOSIS — E1142 Type 2 diabetes mellitus with diabetic polyneuropathy: Secondary | ICD-10-CM | POA: Diagnosis not present

## 2019-06-02 DIAGNOSIS — E538 Deficiency of other specified B group vitamins: Secondary | ICD-10-CM | POA: Diagnosis not present

## 2019-06-02 DIAGNOSIS — E7800 Pure hypercholesterolemia, unspecified: Secondary | ICD-10-CM | POA: Diagnosis not present

## 2019-06-02 DIAGNOSIS — E1165 Type 2 diabetes mellitus with hyperglycemia: Secondary | ICD-10-CM | POA: Diagnosis not present

## 2019-06-02 DIAGNOSIS — I10 Essential (primary) hypertension: Secondary | ICD-10-CM | POA: Diagnosis not present

## 2019-06-02 DIAGNOSIS — Z794 Long term (current) use of insulin: Secondary | ICD-10-CM | POA: Diagnosis not present

## 2019-06-15 ENCOUNTER — Other Ambulatory Visit: Payer: Self-pay

## 2019-06-15 ENCOUNTER — Ambulatory Visit (INDEPENDENT_AMBULATORY_CARE_PROVIDER_SITE_OTHER): Payer: Medicare HMO | Admitting: Orthopaedic Surgery

## 2019-06-15 ENCOUNTER — Encounter: Payer: Self-pay | Admitting: Orthopaedic Surgery

## 2019-06-15 ENCOUNTER — Ambulatory Visit: Payer: Medicare HMO

## 2019-06-15 VITALS — Temp 97.0°F

## 2019-06-15 DIAGNOSIS — S92351D Displaced fracture of fifth metatarsal bone, right foot, subsequent encounter for fracture with routine healing: Secondary | ICD-10-CM | POA: Diagnosis not present

## 2019-06-15 NOTE — Progress Notes (Signed)
CC:  My foot is better.  She has only slight tenderness of the right foot now.  She is wearing sandals.  She has no new trauma.  NV intact.  Gait is normal.  Encounter Diagnosis  Name Primary?  . Closed fracture of base of fifth metatarsal bone of right foot with routine healing Yes   X-rays were done, reported separately.  I will see her in one month.  X-rays then.  Call if any problem.  Precautions discussed.   Electronically Signed Sanjuana Kava, MD 8/6/20208:54 AM

## 2019-07-13 ENCOUNTER — Encounter: Payer: Self-pay | Admitting: Orthopaedic Surgery

## 2019-07-13 ENCOUNTER — Ambulatory Visit (INDEPENDENT_AMBULATORY_CARE_PROVIDER_SITE_OTHER): Payer: Medicare HMO | Admitting: Orthopaedic Surgery

## 2019-07-13 ENCOUNTER — Other Ambulatory Visit: Payer: Self-pay

## 2019-07-13 ENCOUNTER — Ambulatory Visit: Payer: Medicare HMO

## 2019-07-13 DIAGNOSIS — S92351D Displaced fracture of fifth metatarsal bone, right foot, subsequent encounter for fracture with routine healing: Secondary | ICD-10-CM | POA: Diagnosis not present

## 2019-07-13 DIAGNOSIS — S92352G Displaced fracture of fifth metatarsal bone, left foot, subsequent encounter for fracture with delayed healing: Secondary | ICD-10-CM

## 2019-07-13 NOTE — Progress Notes (Signed)
CC:  It only is tender now and then  She has no pain of the right foot.  Only once in a while she has tenderness.  She has no new trauma. She is wearing regular shoes.  X-rays were done and reported separately.  I feel she has a delayed union.  Impression:  Delayed union right fifth metatarsal  I have explained the findings.  I will see her in six weeks.  She may need surgery if it hurts or causes problem.  Call if any problem.  Precautions discussed.   Electronically Signed Sanjuana Kava, MD 9/3/20209:22 AM

## 2019-08-03 DIAGNOSIS — R69 Illness, unspecified: Secondary | ICD-10-CM | POA: Diagnosis not present

## 2019-08-07 DIAGNOSIS — R69 Illness, unspecified: Secondary | ICD-10-CM | POA: Diagnosis not present

## 2019-08-10 DIAGNOSIS — I10 Essential (primary) hypertension: Secondary | ICD-10-CM | POA: Diagnosis not present

## 2019-08-10 DIAGNOSIS — L209 Atopic dermatitis, unspecified: Secondary | ICD-10-CM | POA: Diagnosis not present

## 2019-08-10 DIAGNOSIS — E1140 Type 2 diabetes mellitus with diabetic neuropathy, unspecified: Secondary | ICD-10-CM | POA: Diagnosis not present

## 2019-08-18 DIAGNOSIS — E1165 Type 2 diabetes mellitus with hyperglycemia: Secondary | ICD-10-CM | POA: Diagnosis not present

## 2019-08-18 DIAGNOSIS — E538 Deficiency of other specified B group vitamins: Secondary | ICD-10-CM | POA: Diagnosis not present

## 2019-08-18 DIAGNOSIS — Z794 Long term (current) use of insulin: Secondary | ICD-10-CM | POA: Diagnosis not present

## 2019-08-18 DIAGNOSIS — E7800 Pure hypercholesterolemia, unspecified: Secondary | ICD-10-CM | POA: Diagnosis not present

## 2019-08-24 ENCOUNTER — Other Ambulatory Visit: Payer: Self-pay

## 2019-08-24 ENCOUNTER — Encounter: Payer: Self-pay | Admitting: Orthopaedic Surgery

## 2019-08-24 ENCOUNTER — Ambulatory Visit: Payer: Medicare HMO

## 2019-08-24 ENCOUNTER — Ambulatory Visit (INDEPENDENT_AMBULATORY_CARE_PROVIDER_SITE_OTHER): Payer: Medicare HMO | Admitting: Orthopaedic Surgery

## 2019-08-24 VITALS — BP 134/71 | HR 72 | Ht 68.0 in | Wt 253.0 lb

## 2019-08-24 DIAGNOSIS — S92351G Displaced fracture of fifth metatarsal bone, right foot, subsequent encounter for fracture with delayed healing: Secondary | ICD-10-CM

## 2019-08-24 NOTE — Progress Notes (Signed)
CC:  My foot does not hurt now.  She has no pain of the right foot.  NV intact.  Gait normal.  X-rays were done, reported separately, of the right foot.  There is finally healing of the fracture of the fifth metatarsal, delayed as it was.  Encounter Diagnosis  Name Primary?  . Closed fracture of base of fifth metatarsal bone with delayed healing, right Yes   I will see as needed.  Call if any problem.  Precautions discussed.   Electronically Signed Sanjuana Kava, MD 10/15/20209:32 AM

## 2019-09-01 DIAGNOSIS — Z794 Long term (current) use of insulin: Secondary | ICD-10-CM | POA: Diagnosis not present

## 2019-09-01 DIAGNOSIS — E1165 Type 2 diabetes mellitus with hyperglycemia: Secondary | ICD-10-CM | POA: Diagnosis not present

## 2019-09-01 DIAGNOSIS — E538 Deficiency of other specified B group vitamins: Secondary | ICD-10-CM | POA: Diagnosis not present

## 2019-09-01 DIAGNOSIS — E1142 Type 2 diabetes mellitus with diabetic polyneuropathy: Secondary | ICD-10-CM | POA: Diagnosis not present

## 2019-09-01 DIAGNOSIS — I10 Essential (primary) hypertension: Secondary | ICD-10-CM | POA: Diagnosis not present

## 2019-09-01 DIAGNOSIS — E7800 Pure hypercholesterolemia, unspecified: Secondary | ICD-10-CM | POA: Diagnosis not present

## 2019-09-01 DIAGNOSIS — E113593 Type 2 diabetes mellitus with proliferative diabetic retinopathy without macular edema, bilateral: Secondary | ICD-10-CM | POA: Diagnosis not present

## 2019-09-01 DIAGNOSIS — E559 Vitamin D deficiency, unspecified: Secondary | ICD-10-CM | POA: Diagnosis not present

## 2019-09-21 DIAGNOSIS — N390 Urinary tract infection, site not specified: Secondary | ICD-10-CM | POA: Diagnosis not present

## 2019-10-13 DIAGNOSIS — Z1231 Encounter for screening mammogram for malignant neoplasm of breast: Secondary | ICD-10-CM | POA: Diagnosis not present

## 2019-10-13 DIAGNOSIS — Z6839 Body mass index (BMI) 39.0-39.9, adult: Secondary | ICD-10-CM | POA: Diagnosis not present

## 2019-10-13 DIAGNOSIS — Z124 Encounter for screening for malignant neoplasm of cervix: Secondary | ICD-10-CM | POA: Diagnosis not present

## 2019-12-01 DIAGNOSIS — E7800 Pure hypercholesterolemia, unspecified: Secondary | ICD-10-CM | POA: Diagnosis not present

## 2019-12-01 DIAGNOSIS — I10 Essential (primary) hypertension: Secondary | ICD-10-CM | POA: Diagnosis not present

## 2019-12-01 DIAGNOSIS — E1142 Type 2 diabetes mellitus with diabetic polyneuropathy: Secondary | ICD-10-CM | POA: Diagnosis not present

## 2019-12-01 DIAGNOSIS — E113593 Type 2 diabetes mellitus with proliferative diabetic retinopathy without macular edema, bilateral: Secondary | ICD-10-CM | POA: Diagnosis not present

## 2019-12-01 DIAGNOSIS — E559 Vitamin D deficiency, unspecified: Secondary | ICD-10-CM | POA: Diagnosis not present

## 2019-12-01 DIAGNOSIS — R69 Illness, unspecified: Secondary | ICD-10-CM | POA: Diagnosis not present

## 2019-12-01 DIAGNOSIS — E538 Deficiency of other specified B group vitamins: Secondary | ICD-10-CM | POA: Diagnosis not present

## 2019-12-01 DIAGNOSIS — E1165 Type 2 diabetes mellitus with hyperglycemia: Secondary | ICD-10-CM | POA: Diagnosis not present

## 2019-12-01 DIAGNOSIS — Z794 Long term (current) use of insulin: Secondary | ICD-10-CM | POA: Diagnosis not present

## 2023-12-14 ENCOUNTER — Encounter (INDEPENDENT_AMBULATORY_CARE_PROVIDER_SITE_OTHER): Payer: Self-pay | Admitting: *Deleted

## 2024-04-12 ENCOUNTER — Encounter (HOSPITAL_COMMUNITY): Payer: Self-pay

## 2024-04-12 ENCOUNTER — Other Ambulatory Visit: Payer: Self-pay

## 2024-04-12 ENCOUNTER — Ambulatory Visit (HOSPITAL_COMMUNITY)

## 2024-04-12 DIAGNOSIS — R2689 Other abnormalities of gait and mobility: Secondary | ICD-10-CM | POA: Diagnosis not present

## 2024-04-12 DIAGNOSIS — Z9889 Other specified postprocedural states: Secondary | ICD-10-CM | POA: Insufficient documentation

## 2024-04-12 DIAGNOSIS — S82031D Displaced transverse fracture of right patella, subsequent encounter for closed fracture with routine healing: Secondary | ICD-10-CM | POA: Insufficient documentation

## 2024-04-12 DIAGNOSIS — W010XXD Fall on same level from slipping, tripping and stumbling without subsequent striking against object, subsequent encounter: Secondary | ICD-10-CM | POA: Insufficient documentation

## 2024-04-12 DIAGNOSIS — Z7409 Other reduced mobility: Secondary | ICD-10-CM | POA: Diagnosis present

## 2024-04-12 NOTE — Therapy (Signed)
 OUTPATIENT PHYSICAL THERAPY LOWER EXTREMITY EVALUATION   Patient Name: Gabriella Holmes MRN: [id number] DOB:08/14/1951, 73 y.o., female Today's Date: 04/12/2024  END OF SESSION:  PT End of Session - 04/12/24 1115     Visit Number 1    Date for PT Re-Evaluation 05/24/24    Authorization Type CHAM[employer]    Authorization Time Period no auth required    Authorization - Visit Number 0    Progress Note Due on Visit 10    PT Start Time 1015    PT Stop Time 1100    PT Time Calculation (min) 45 min    Activity Tolerance Patient tolerated treatment well    Behavior During Therapy WFL for tasks assessed/performed             Past Medical History:  Diagnosis Date   GERD (gastroesophageal reflux disease)    High cholesterol    Hypertension    Type II diabetes mellitus (HCC)    Past Surgical History:  Procedure Laterality Date   BACK SURGERY     CARDIAC CATHETERIZATION  2001   CATARACT EXTRACTION W/ INTRAOCULAR LENS  IMPLANT, BILATERAL Bilateral ~ 2011   CHOLECYSTECTOMY  ~ 2012   COLONOSCOPY N/A 01/09/2014   Procedure: COLONOSCOPY;  Surgeon: Suzette Espy, MD;  [facility];  Service: Endoscopy;  Laterality: N/A;  7:30 AM-rescheduled to 845 [address] notified pt   CYST EXCISION  1980's   from facial area.    EYE SURGERY     LUMBAR DISC SURGERY  1989   ORIF HUMERUS FRACTURE Right 09/21/2013   Procedure: OPEN REDUCTION INTERNAL FIXATION (ORIF) PROXIMAL HUMERUS FRACTURE;  Surgeon: Derald Flattery, MD;  Location: [facility];  Service: Orthopedics;  Laterality: Right;  Right open reduction internal fixation humerus fracture   ORIF PROXIMAL HUMERUS FRACTURE Right 09/21/2013   PHOTOCOAGULATION WITH LASER Bilateral ~ 2011-2014   "for diabetic retinopathy" (09/21/2013)   TRIGGER FINGER RELEASE Left 1980's   TUBAL LIGATION  1975   Patient Active Problem List   Diagnosis Date Noted   Diabetic polyneuropathy (HCC) 01/26/2017   Hypercholesterolemia 01/26/2017   Hypertension,  essential 01/26/2017   Type 2 diabetes mellitus with hyperglycemia, with long-term current use of insulin  (HCC) 01/26/2017   Vitamin B12 deficiency 01/26/2017   Vitamin D insufficiency 01/26/2017   Fracture of proximal humerus 09/07/2013   Humerus fracture 09/04/2013   Vitreous hemorrhage, right (HCC) 03/02/2013   Posterior vitreous detachment 06/27/2012   Pseudophakia 06/27/2012   Nuclear sclerotic cataract of both eyes 08/06/2011   Proliferative diabetic retinopathy of both eyes without macular edema associated with type 2 diabetes mellitus (HCC) 08/06/2011    PCP: Artemisa Bile, MD   REFERRING PROVIDER: Dalia Ducking, MD  REFERRING DIAG: S82.031A (ICD-10-CM) - Displaced transverse fracture of right patella, initial encounter for closed fracture  THERAPY DIAG:  S/P right knee surgery  Impaired functional mobility, balance, gait, and endurance  Rationale for Evaluation and Treatment: Rehabilitation  ONSET DATE: 04/04/24 (Surgery)  SUBJECTIVE:   SUBJECTIVE STATEMENT: May the 16th fell at the [facility] for husbands appointment. Fell in the hallway due to tripping over own feet. Pt also describes falling in 2014 and breaking her arm. Pt states she would like to improve her balance during therapy episode. Pt states she has a follow up appointment at [facility] hospital Monday and will obtain protocol at that time.   PERTINENT HISTORY: Shoulder surgery due to another fall PAIN:  Are you having pain? Yes: NPRS scale: 3-4/10  Pain location: front of right knee Pain description: dull, tight feeling Aggravating factors: tenderness Relieving factors: Advil and tylenol , ice, elevation  PRECAUTIONS: Knee and Fall  RED FLAGS: Pt states she has had stomach problems since before the surgery.   WEIGHT BEARING RESTRICTIONS: Yes WBAT per patient   FALLS:  Has patient fallen in last 6 months? Yes. 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  DIAGNOSTIC FINDINGS: none on chart  PATIENT SURVEYS:  LEFS: TBA  COGNITION: Overall cognitive status: Within functional limits for tasks assessed     SENSATION: WFL Not tested at incision  EDEMA:  In immobilizer, TBA when cleared immobilizer off  PALPATION: Pt still with surgical bandaging, slight bruising on front, increased bruising on the back  LOWER EXTREMITY ROM:  Active ROM Right eval Left eval  Hip flexion    Hip extension    Hip abduction    Hip adduction    Hip internal rotation    Hip external rotation    Knee flexion TBA TBA  Knee extension TBA TBA  Ankle dorsiflexion    Ankle plantarflexion    Ankle inversion    Ankle eversion     (Blank rows = not tested)  LOWER EXTREMITY MMT:  MMT Right eval Left eval  Hip flexion TBA 3+  Hip extension    Hip abduction    Hip adduction    Hip internal rotation    Hip external rotation    Knee flexion TBA 4-  Knee extension TBA 4-  Ankle dorsiflexion 4 4  Ankle plantarflexion    Ankle inversion    Ankle eversion     (Blank rows = not tested)    FUNCTIONAL TESTS:  5 times sit to stand: TBA, pt unable to get up with out assist 2 minute walk test: TBA, pt unable to complete due to need to sit down  GAIT: Distance walked: 75 feet in and out from treatment area Assistive device utilized: Walker - 2 wheeled Level of assistance: Modified independence Comments: Pt demonstrates antalgic gait pattern, could be influenced by 0 degree locking of immobilizing brace on RLE. Decreased stride length and  velocity noted.                                                                                                                                TREATMENT DATE:  04/12/2024   Evaluation: -ROM measured, Strength assessed, HEP prescribed, pt educated on prognosis, findings, and importance of HEP compliance if given.  Therapeutic Exercise: -Standing  3 way hip 1 sets 5 reps, RLE only, pt cued for upright trunk and maintaining of neutral spine        PATIENT EDUCATION:  Education details: Pt was educated on findings of PT evaluation, prognosis, frequency of therapy visits and rationale, attendance policy, and HEP if given.   Person educated: Patient and Spouse Education method: Explanation, Verbal cues, and Handouts Education comprehension: verbalized understanding, tactile cues required, and needs further education  HOME EXERCISE PROGRAM: Access Code: [email or web address] URL: [email or web address] Date: 04/12/2024 Prepared by: Armond Bertin  Exercises - Standing 3-way Hip with Otho Blitz  - 1 x daily - 7 x weekly - 3 sets - 10 reps - Seated Quad Set  - 1 x daily - 7 x weekly - 3 sets - 10 reps  ASSESSMENT:  CLINICAL IMPRESSION: Patient is a 73 y.o. female who was seen today for physical therapy evaluation and treatment for S82.031A (ICD-10-CM) - Displaced transverse fracture of right patella, initial encounter for closed fracture.   Patient demonstrates decreased RLE strength, abnormal pain rating, impaired functional mobility, difficulty with ambulation and impaired balance. Patient demonstrates difficulty with ambulation during today's session with heavy dependence on UEs, decreased stride length and velocity noted. Patient also demonstrates difficulty getting out of the car and requires assist of therapist, pt able to walk into clinic with modified independence, RW. Patient requires education on role of PT, plan of care, HEP and fall risk. Pt was not taken out of knee immobilizing  brace due to lack of protocol provided and HEP was prescribed on a pain free basis. Patient would benefit from skilled physical therapy for increased endurance with ambulation, increased RLE strength, improved functional mobility and improved balance for improved gait quality, return to higher level of function with ADLs, return to driving, and progress towards therapy goals.   OBJECTIVE IMPAIRMENTS: Abnormal gait, decreased activity tolerance, decreased balance, decreased endurance, decreased knowledge of use of DME, decreased mobility, difficulty walking, decreased ROM, decreased strength, increased edema, and pain.   ACTIVITY LIMITATIONS: carrying, lifting, bending, sitting, standing, squatting, stairs, transfers, bed mobility, bathing, toileting, locomotion level, and driving  PARTICIPATION LIMITATIONS: meal prep, cleaning, laundry, driving, shopping, community activity, occupation, and church  PERSONAL FACTORS: Age, Fitness, Past/current experiences, Time since onset of injury/illness/exacerbation, and 1 comorbidity: history of falling are also affecting patient's functional outcome.   REHAB POTENTIAL: Good  CLINICAL DECISION MAKING: Stable/uncomplicated  EVALUATION COMPLEXITY: Low   GOALS: Goals reviewed with patient? No  SHORT TERM GOALS: Target date: 05/03/24  Patient will demonstrate evidence of independence with individualized HEP and will report compliance for at least 3 days per week for optimized progression towards remaining therapy goals. Baseline:  Goal status: INITIAL  2.  Patient will report a decrease in pain level during community ambulation by at least 2 points for improved quality of life. Baseline: 3/10 Goal status: INITIAL     LONG TERM GOALS: Target date: 05/24/24  Pt will demonstrate a an increase of at least 9 points on the LEFS for improved performance of community ambulation and ADL. Baseline: see obective Goal status: INITIAL  2.  Pt will improve 2  MWT by 140 in order to demonstrate improved functional ambulatory capacity in community setting.  Baseline: Pt unable to complete due to activity intolerance. Goal status: INITIAL  3.  Pt will demonstrate WFL ROM (flexion and extension) in right knee, for increased mobility and maximal efficiency of gait cycle during ambulation. Baseline: protocol, pt locked at 0 degrees upon presenting  to evaluation Goal status: INITIAL  4.  Pt will demonstrate at least 4/5 MMT for right lower extremity for increased strength during ADL and community ambulation. Baseline: see objective Goal status: INITIAL  5.  Pt will improve 5TSTS by 2.3 seconds in order to improve functional strength during functional transfers. Baseline: see objective Goal status: INITIAL    PLAN:  PT FREQUENCY: 2x/week  PT DURATION: 6 weeks  PLANNED INTERVENTIONS: 97110-Therapeutic exercises, 97530- Therapeutic activity, 97112- Neuromuscular re-education, 97535- Self Care, 16109- Manual therapy, [phone]- Gait training, Patient/Family education, Balance training, Stair training, Joint mobilization, Scar mobilization, DME instructions, Cryotherapy, and Moist heat  PLAN FOR NEXT SESSION: LEFS, , 5TSTS to pt toleration, obtain protocol   Armond Bertin, PT, DPT [facility] Office: [phone] 3:45 PM, 04/12/24

## 2024-04-18 ENCOUNTER — Encounter (HOSPITAL_COMMUNITY)

## 2024-04-20 ENCOUNTER — Ambulatory Visit (HOSPITAL_COMMUNITY)

## 2024-04-20 DIAGNOSIS — Z9889 Other specified postprocedural states: Secondary | ICD-10-CM

## 2024-04-20 DIAGNOSIS — Z7409 Other reduced mobility: Secondary | ICD-10-CM

## 2024-04-20 DIAGNOSIS — S82031D Displaced transverse fracture of right patella, subsequent encounter for closed fracture with routine healing: Secondary | ICD-10-CM | POA: Diagnosis not present

## 2024-04-20 NOTE — Therapy (Signed)
 OUTPATIENT PHYSICAL THERAPY LOWER EXTREMITY TREATMENT   Patient Name: Gabriella Holmes MRN: [id number] DOB:1951/06/13, 73 y.o., female Today's Date: 04/20/2024  END OF SESSION:  PT End of Session - 04/20/24 1015     Visit Number 2    Number of Visits 12    Date for PT Re-Evaluation 05/24/24    Authorization Type CHAMPVA    Authorization Time Period no auth required    Progress Note Due on Visit 10    PT Start Time 1015    PT Stop Time 1055    PT Time Calculation (min) 40 min    Activity Tolerance Patient tolerated treatment well    Behavior During Therapy WFL for tasks assessed/performed          Past Medical History:  Diagnosis Date   GERD (gastroesophageal reflux disease)    High cholesterol    Hypertension    Type II diabetes mellitus (HCC)    Past Surgical History:  Procedure Laterality Date   BACK SURGERY     CARDIAC CATHETERIZATION  2001   CATARACT EXTRACTION W/ INTRAOCULAR LENS  IMPLANT, BILATERAL Bilateral ~ 2011   CHOLECYSTECTOMY  ~ 2012   COLONOSCOPY N/A 01/09/2014   Procedure: COLONOSCOPY;  Surgeon: Suzette Espy, MD;  [facility];  Service: Endoscopy;  Laterality: N/A;  7:30 AM-rescheduled to 845 [address]is notified pt   CYST EXCISION  1980's   from facial area.    EYE SURGERY     LUMBAR DISC SURGERY  1989   ORIF HUMERUS FRACTURE Right 09/21/2013   Procedure: OPEN REDUCTION INTERNAL FIXATION (ORIF) PROXIMAL HUMERUS FRACTURE;  Surgeon: Derald Flattery, MD;  Location: [facility] OR;  Service: Orthopedics;  Laterality: Right;  Right open reduction internal fixation humerus fracture   ORIF PROXIMAL HUMERUS FRACTURE Right 09/21/2013   PHOTOCOAGULATION WITH LASER Bilateral ~ 2011-2014   for diabetic retinopathy (09/21/2013)   TRIGGER FINGER RELEASE Left 1980's   TUBAL LIGATION  1975   Patient Active Problem List   Diagnosis Date Noted   Diabetic polyneuropathy (HCC) 01/26/2017   Hypercholesterolemia 01/26/2017   Hypertension, essential 01/26/2017    Type 2 diabetes mellitus with hyperglycemia, with long-term current use of insulin  (HCC) 01/26/2017   Vitamin B12 deficiency 01/26/2017   Vitamin D insufficiency 01/26/2017   Fracture of proximal humerus 09/07/2013   Humerus fracture 09/04/2013   Vitreous hemorrhage, right (HCC) 03/02/2013   Posterior vitreous detachment 06/27/2012   Pseudophakia 06/27/2012   Nuclear sclerotic cataract of both eyes 08/06/2011   Proliferative diabetic retinopathy of both eyes without macular edema associated with type 2 diabetes mellitus (HCC) 08/06/2011    PCP: Artemisa Bile, MD   REFERRING PROVIDER: Dalia Ducking, MD  REFERRING DIAG: S82.031A (ICD-10-CM) - Displaced transverse fracture of right patella, initial encounter for closed fracture  THERAPY DIAG:  S/P right knee surgery  Impaired functional mobility, balance, gait, and endurance  Rationale for Evaluation and Treatment: Rehabilitation  ONSET DATE: 04/04/24 (Surgery)  SUBJECTIVE:   SUBJECTIVE STATEMENT: Saw MD on Monday.  X-ray shows healing.  Continue with therapy; some soreness left non surgical knee.     May the 16th fell at the [facility] for husbands appointment. Fell in the hallway due to tripping over own feet. Pt also describes falling in 2014 and breaking her arm. Pt states she would like to improve her balance during therapy episode. Pt states she has a follow up appointment at [facility] hospital Monday and will obtain protocol at that time.  PERTINENT HISTORY: Shoulder surgery due to another fall PAIN:  Are you having pain? Yes: NPRS scale: 3-4/10 Pain location: front of right knee Pain description: dull, tight feeling Aggravating factors: tenderness Relieving factors: Advil and tylenol , ice, elevation  PRECAUTIONS: Knee and Fall  RED FLAGS: Pt states she has had stomach problems since before the surgery.   WEIGHT BEARING RESTRICTIONS: Yes WBAT per patient   FALLS:  Has patient fallen in last 6 months? Yes.  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  DIAGNOSTIC FINDINGS: none on chart  PATIENT SURVEYS:  LEFS: TBA  COGNITION: Overall cognitive status: Within functional limits for tasks assessed     SENSATION: WFL Not tested at incision  EDEMA:  In immobilizer, TBA when cleared immobilizer off  PALPATION: Pt still with surgical bandaging, slight bruising on front, increased bruising on the back  LOWER EXTREMITY ROM:  Active ROM Right eval Left eval  Hip flexion    Hip extension    Hip abduction    Hip adduction    Hip internal rotation    Hip external rotation    Knee flexion TBA TBA  Knee extension TBA TBA  Ankle dorsiflexion    Ankle plantarflexion    Ankle inversion    Ankle eversion     (Blank rows = not tested)  LOWER EXTREMITY MMT:  MMT Right eval Left eval  Hip flexion TBA 3+  Hip extension    Hip abduction    Hip adduction    Hip internal rotation    Hip external rotation    Knee flexion TBA 4-  Knee extension TBA 4-  Ankle dorsiflexion 4 4  Ankle plantarflexion    Ankle inversion    Ankle eversion     (Blank rows = not tested)    FUNCTIONAL TESTS:  5 times sit to stand: TBA, pt unable to get up with out assist 2 minute walk test: TBA, pt unable to complete due to need to sit down  GAIT: Distance walked: 75 feet in and out from treatment area Assistive device utilized: Walker - 2 wheeled Level of assistance: Modified independence Comments: Pt demonstrates antalgic gait pattern, could be  influenced by 0 degree locking of immobilizing brace on RLE. Decreased stride length and velocity noted.                                                                                                                                TREATMENT DATE:  04/20/24 LEFS 18/80 22.5% 2 MWT  133 ft with RW Review of HEP and goals 5 times sit to stand 1 min and 13 sec using hands to assist up to standing to RW Supine: Quad sets 5 hold x 10 Patella mobs gentle x 10 each way Updated HEP  04/12/2024   Evaluation: -ROM measured, Strength assessed, HEP prescribed, pt educated on prognosis, findings, and importance of HEP compliance if given.  Therapeutic Exercise: -Standing 3 way hip 1 sets 5 reps, RLE only, pt cued for upright trunk and maintaining of neutral spine        PATIENT EDUCATION:  Education details: Pt was educated on findings of PT evaluation, prognosis, frequency of therapy visits and rationale, attendance policy, and HEP if given.   Person educated: Patient and Spouse Education method: Explanation, Verbal cues, and Handouts Education comprehension: verbalized understanding, tactile cues required, and needs further education  HOME EXERCISE PROGRAM: Access Code: [email or web address] URL: [email or web address] Date: 04/12/2024 Prepared by: Armond Bertin  Exercises - Standing 3-way Hip with Otho Blitz  - 1 x daily - 7 x weekly - 3 sets - 10 reps - Seated Quad Set  - 1 x daily - 7 x weekly - 3 sets - 10 reps  ASSESSMENT:  CLINICAL IMPRESSION: Today's session started with a  review of HEP and goals; completed testing with 5 times sit to stand and 2 MWT.  Patient needs cues for nose over toes to transfer weight forward to initiate sit to stand and able to perform with CGA only using hands to assist up.  Improved quad set noted in supine.  Updated HEP.  Patient will benefit from continued skilled therapy servicesto address deficits and promote return to optimal function.       Eval:Patient is a 73 y.o. female who was seen today for physical therapy evaluation and treatment for S82.031A (ICD-10-CM) - Displaced transverse fracture of right patella, initial encounter for closed fracture.   Patient demonstrates decreased RLE strength, abnormal pain rating, impaired functional mobility, difficulty with ambulation and impaired balance. Patient demonstrates difficulty with ambulation during today's session with heavy dependence on UEs, decreased stride length and velocity noted. Patient also demonstrates difficulty getting out of the car and requires assist of therapist, pt able to walk into clinic with modified independence, RW. Patient requires education on role of PT, plan of care, HEP and fall risk. Pt was not taken out of knee immobilizing brace due to lack of protocol provided and HEP was prescribed on a pain free basis. Patient would benefit from skilled physical therapy for increased endurance with ambulation, increased RLE strength, improved functional mobility and improved balance for improved gait quality, return to higher level of function with ADLs, return to driving, and progress towards therapy goals.   OBJECTIVE IMPAIRMENTS: Abnormal gait, decreased activity tolerance, decreased balance, decreased endurance, decreased knowledge of use of DME, decreased mobility, difficulty walking, decreased ROM, decreased strength, increased edema, and pain.   ACTIVITY LIMITATIONS: carrying, lifting, bending, sitting, standing, squatting, stairs, transfers, bed mobility, bathing, toileting, locomotion level, and driving  PARTICIPATION LIMITATIONS: meal prep, cleaning, laundry, driving, shopping, community activity, occupation, and church  PERSONAL FACTORS: Age, Fitness, Past/current experiences, Time since onset of injury/illness/exacerbation, and 1 comorbidity: history of falling are also affecting patient's functional outcome.   REHAB POTENTIAL: Good  CLINICAL DECISION  MAKING: Stable/uncomplicated  EVALUATION COMPLEXITY: Low   GOALS: Goals reviewed with patient? No  SHORT TERM GOALS: Target date: 05/03/24  Patient will demonstrate evidence of independence with individualized HEP and will report compliance for  at least 3 days per week for optimized progression towards remaining therapy goals. Baseline:  Goal status: INITIAL  2.  Patient will report a decrease in pain level during community ambulation by at least 2 points for improved quality of life. Baseline: 3/10 Goal status: INITIAL     LONG TERM GOALS: Target date: 05/24/24  Pt will demonstrate a an increase of at least 9 points on the LEFS for improved performance of community ambulation and ADL. Baseline: see obective Goal status: INITIAL  2.  Pt will improve 2 MWT by 140 in order to demonstrate improved functional ambulatory capacity in community setting.  Baseline: 133 ft with RW Goal status: INITIAL  3.  Pt will demonstrate WFL ROM (flexion and extension) in right knee, for increased mobility and maximal efficiency of gait cycle during ambulation. Baseline: protocol, pt locked at 0 degrees upon presenting to evaluation Goal status: INITIAL  4.  Pt will demonstrate at least 4/5 MMT for right lower extremity for increased strength during ADL and community ambulation. Baseline: see objective Goal status: INITIAL  5.  Pt will improve 5TSTS by 2.3 seconds in order to improve functional strength during functional transfers. Baseline: see objective Goal status: INITIAL    PLAN:  PT FREQUENCY: 2x/week  PT DURATION: 6 weeks  PLANNED INTERVENTIONS: 97110-Therapeutic exercises, 97530- Therapeutic activity, 97112- Neuromuscular re-education, 97535- Self Care, [id number]- Manual therapy, [id number]- Gait training, Patient/Family education, Balance training, Stair training, Joint mobilization, Scar mobilization, DME instructions, Cryotherapy, and Moist heat  PLAN FOR NEXT SESSION: progress  protocol under media; currently 16 days s/p  11:05 AM, 04/20/24 Nysia Dell Small Mahati Vajda MPT [place] [facility] [place] [phone] Ph:[phone]

## 2024-04-25 ENCOUNTER — Ambulatory Visit (HOSPITAL_COMMUNITY)

## 2024-04-25 DIAGNOSIS — S82031D Displaced transverse fracture of right patella, subsequent encounter for closed fracture with routine healing: Secondary | ICD-10-CM | POA: Diagnosis not present

## 2024-04-25 DIAGNOSIS — Z9889 Other specified postprocedural states: Secondary | ICD-10-CM

## 2024-04-25 DIAGNOSIS — Z7409 Other reduced mobility: Secondary | ICD-10-CM

## 2024-04-25 NOTE — Therapy (Signed)
 OUTPATIENT PHYSICAL THERAPY LOWER EXTREMITY TREATMENT   Patient Name: Gabriella Holmes MRN: [id number] DOB:Mar 28, 1951, 73 y.o., female Today's Date: 04/25/2024  END OF SESSION:  PT End of Session - 04/25/24 1150     Visit Number 3    Number of Visits 12    Date for PT Re-Evaluation 05/24/24    Authorization Type [employer]    Authorization Time Period no auth required    Progress Note Due on Visit 10    PT Start Time 1148    PT Stop Time 1228    PT Time Calculation (min) 40 min    Activity Tolerance Patient tolerated treatment well    Behavior During Therapy WFL for tasks assessed/performed          Past Medical History:  Diagnosis Date   GERD (gastroesophageal reflux disease)    High cholesterol    Hypertension    Type II diabetes mellitus (HCC)    Past Surgical History:  Procedure Laterality Date   BACK SURGERY     CARDIAC CATHETERIZATION  2001   CATARACT EXTRACTION W/ INTRAOCULAR LENS  IMPLANT, BILATERAL Bilateral ~ 2011   CHOLECYSTECTOMY  ~ 2012   COLONOSCOPY N/A 01/09/2014   Procedure: COLONOSCOPY;  Surgeon: Suzette Espy, MD;  Location: [facility];  Service: Endoscopy;  Laterality: N/A;  7:30 AM-rescheduled to 845 Doris notified pt   CYST EXCISION  1980's   from facial area.    EYE SURGERY     LUMBAR DISC SURGERY  1989   ORIF HUMERUS FRACTURE Right 09/21/2013   Procedure: OPEN REDUCTION INTERNAL FIXATION (ORIF) PROXIMAL HUMERUS FRACTURE;  Surgeon: Derald Flattery, MD;  Location: [facility] OR;  Service: Orthopedics;  Laterality: Right;  Right open reduction internal fixation humerus fracture   ORIF PROXIMAL HUMERUS FRACTURE Right 09/21/2013   PHOTOCOAGULATION WITH LASER Bilateral ~ 2011-2014   for diabetic retinopathy (09/21/2013)   TRIGGER FINGER RELEASE Left 1980's   TUBAL LIGATION  1975   Patient Active Problem List   Diagnosis Date Noted   Diabetic polyneuropathy (HCC) 01/26/2017   Hypercholesterolemia 01/26/2017   Hypertension, essential 01/26/2017    Type 2 diabetes mellitus with hyperglycemia, with long-term current use of insulin  (HCC) 01/26/2017   Vitamin B12 deficiency 01/26/2017   Vitamin D insufficiency 01/26/2017   Fracture of proximal humerus 09/07/2013   Humerus fracture 09/04/2013   Vitreous hemorrhage, right (HCC) 03/02/2013   Posterior vitreous detachment 06/27/2012   Pseudophakia 06/27/2012   Nuclear sclerotic cataract of both eyes 08/06/2011   Proliferative diabetic retinopathy of both eyes without macular edema associated with type 2 diabetes mellitus (HCC) 08/06/2011    PCP: Artemisa Bile, MD   REFERRING PROVIDER: Dalia Ducking, MD  REFERRING DIAG: S82.031A (ICD-10-CM) - Displaced transverse fracture of right patella, initial encounter for closed fracture  THERAPY DIAG:  S/P right knee surgery  Impaired functional mobility, balance, gait, and endurance  Rationale for Evaluation and Treatment: Rehabilitation  ONSET DATE: 04/04/24 (Surgery)  SUBJECTIVE:   SUBJECTIVE STATEMENT: No pain today; trying to do HEP.  S/p 3 weeks today   May the 16th fell at the [facility] for husbands appointment. Fell in the hallway due to tripping over own feet. Pt also describes falling in 2014 and breaking her arm. Pt states she would like to improve her balance during therapy episode. Pt states she has a follow up appointment at [facility] hospital Monday and will obtain protocol at that time.   PERTINENT HISTORY: Shoulder surgery due to another fall  PAIN:  Are you having pain? Yes: NPRS scale: 3-4/10 Pain location: front of right knee Pain description: dull, tight feeling Aggravating factors: tenderness Relieving factors: Advil and tylenol , ice, elevation  PRECAUTIONS: Knee and Fall  RED FLAGS: Pt states she has had stomach problems since before the surgery.   WEIGHT BEARING RESTRICTIONS: Yes WBAT per patient   FALLS:  Has patient fallen in last 6 months? Yes. 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  DIAGNOSTIC FINDINGS: none on chart  PATIENT SURVEYS:  LEFS: TBA  COGNITION: Overall cognitive status: Within functional limits for tasks assessed     SENSATION: WFL Not tested at incision  EDEMA:  In immobilizer, TBA when cleared immobilizer off  PALPATION: Pt still with surgical bandaging, slight bruising on front, increased bruising on the back  LOWER EXTREMITY ROM:  Active ROM Right eval Left eval  Hip flexion    Hip extension    Hip abduction    Hip adduction    Hip internal rotation    Hip external rotation    Knee flexion TBA TBA  Knee extension TBA TBA  Ankle dorsiflexion    Ankle plantarflexion    Ankle inversion    Ankle eversion     (Blank rows = not tested)  LOWER EXTREMITY MMT:  MMT Right eval Left eval  Hip flexion TBA 3+  Hip extension    Hip abduction    Hip adduction    Hip internal rotation    Hip external rotation    Knee flexion TBA 4-  Knee extension TBA 4-  Ankle dorsiflexion 4 4  Ankle plantarflexion    Ankle inversion    Ankle eversion     (Blank rows = not tested)    FUNCTIONAL TESTS:  5 times sit to stand: TBA, pt unable to get up with out assist 2 minute walk test: TBA, pt unable to complete due to need to sit down  GAIT: Distance walked: 75 feet in and out from treatment area Assistive device utilized: Walker - 2 wheeled Level of assistance: Modified independence Comments: Pt demonstrates antalgic gait pattern, could be influenced by 0 degree locking of immobilizing brace  on RLE. Decreased stride length and velocity noted.                                                                                                                                TREATMENT DATE:  04/25/24 Standing  Heel raises 2 x 10 Hip extension 2 x 10 right leg Hip  abduction 2 x 10 right leg Supine: 5 hold x 10 quad set Heel slides 0-30 AAROM with PT and with strap x 10 total Ankle red theraband dorsiflexion, inversion and eversion x 10 each Updated HEP   04/20/24 LEFS 18/80 22.5% 2 MWT   133 ft with RW Review of HEP and goals 5 times sit to stand 1 min and 13 sec using hands to assist up to standing to RW Supine: Quad sets 5 hold x 10 Patella mobs gentle x 10 each way Updated HEP  04/12/2024   Evaluation: -ROM measured, Strength assessed, HEP prescribed, pt educated on prognosis, findings, and importance of HEP compliance if given.  Therapeutic Exercise: -Standing 3 way hip 1 sets 5 reps, RLE only, pt cued for upright trunk and maintaining of neutral spine        PATIENT EDUCATION:  Education details: Pt was educated on findings of PT evaluation, prognosis, frequency of therapy visits and rationale, attendance policy, and HEP if given.   Person educated: Patient and Spouse Education method: Explanation, Verbal cues, and Handouts Education comprehension: verbalized understanding, tactile cues required, and needs further education  HOME EXERCISE PROGRAM: Access Code: [email or web address] URL: [email or web address] Date: 04/25/2024 Prepared by: [facility]  Exercises - Supine Quadricep Sets  - 2 x daily - 7 x weekly - 1 sets - 10 reps - 5 sec hold - gentle 4 way patellar glides  - 2 x daily - 7 x weekly - 1 sets - 10 reps - Standing Heel Raise with Support  - 2 x daily - 7 x weekly - 2 sets - 10 reps - Supine Heel Slide with Strap  - 2 x daily - 7 x weekly - 1 sets - 10 reps - to 30 degrees only hold - Long Sitting Ankle Plantar Flexion with Resistance  - 2 x  daily - 7 x weekly - 2 sets - 10 reps - Long Sitting Ankle Eversion with Resistance  - 2 x daily - 7 x weekly - 2 sets - 10 reps - Long Sitting Ankle Inversion with Resistance  - 2 x daily - 7 x weekly - 2 sets - 10 reps - Ankle Inversion with Resistance  - 2 x daily - 7 x weekly - 2 sets - 10 reps Access Code: [email or web address] URL: [email or web address] Date: 04/12/2024 Prepared by: Armond Bertin  Exercises - Standing 3-way Hip with Otho Blitz  - 1 x daily - 7 x weekly - 3 sets - 10 reps - Seated Quad Set  - 1 x daily - 7 x weekly - 3 sets - 10 reps  ASSESSMENT:  CLINICAL IMPRESSION: Today's session's with focus on right lower extremity mobility and strength per protocol.  She is s/p 3 weeks today.  Noted decreased swelling right knee today.  Patient able to flex the right knee AAROM to 30 degrees today.  Updated HEP.   Patient will benefit from continued skilled therapy servicesto address deficits and promote return to optimal function.      Eval:Patient is a 73 y.o. female who was seen today for physical therapy evaluation and treatment for S82.031A (ICD-10-CM) - Displaced transverse fracture of right patella, initial encounter for closed fracture.   Patient demonstrates decreased RLE strength, abnormal pain rating, impaired functional mobility, difficulty with ambulation and impaired balance. Patient demonstrates difficulty with ambulation during today's session with heavy dependence on UEs, decreased stride length and velocity noted. Patient also demonstrates difficulty getting out of the car and requires assist of therapist,  pt able to walk into clinic with modified independence, RW. Patient requires education on role of PT, plan of care, HEP and fall risk. Pt was not taken out of knee immobilizing brace due to lack of protocol provided and HEP was prescribed on a pain free basis. Patient would benefit from skilled physical therapy for increased endurance with ambulation, increased RLE  strength, improved functional mobility and improved balance for improved gait quality, return to higher level of function with ADLs, return to driving, and progress towards therapy goals.   OBJECTIVE IMPAIRMENTS: Abnormal gait, decreased activity tolerance, decreased balance, decreased endurance, decreased knowledge of use of DME, decreased mobility, difficulty walking, decreased ROM, decreased strength, increased edema, and pain.   ACTIVITY LIMITATIONS: carrying, lifting, bending, sitting, standing, squatting, stairs, transfers, bed mobility, bathing, toileting, locomotion level, and driving  PARTICIPATION LIMITATIONS: meal prep, cleaning, laundry, driving, shopping, community activity, occupation, and church  PERSONAL FACTORS: Age, Fitness, Past/current experiences, Time since onset of injury/illness/exacerbation, and 1 comorbidity: history of falling are also affecting patient's functional outcome.   REHAB POTENTIAL: Good  CLINICAL DECISION MAKING: Stable/uncomplicated  EVALUATION COMPLEXITY: Low   GOALS: Goals reviewed with patient? No  SHORT TERM GOALS: Target date: 05/03/24  Patient will demonstrate evidence of independence with individualized HEP and will report compliance for at least 3 days per week for optimized progression towards remaining therapy goals. Baseline:  Goal status: INITIAL  2.  Patient will report a decrease in pain level during community ambulation by at least 2 points for improved quality of life. Baseline: 3/10 Goal status: INITIAL     LONG TERM GOALS: Target date: 05/24/24  Pt will demonstrate a an increase of at least 9 points on the LEFS for improved performance of community ambulation and ADL. Baseline: see obective Goal status: INITIAL  2.  Pt will improve 2 MWT by 140 in order to demonstrate improved functional ambulatory capacity in community setting.  Baseline: 133 ft with RW Goal status: INITIAL  3.  Pt will demonstrate WFL ROM (flexion  and extension) in right knee, for increased mobility and maximal efficiency of gait cycle during ambulation. Baseline: protocol, pt locked at 0 degrees upon presenting to evaluation Goal status: INITIAL  4.  Pt will demonstrate at least 4/5 MMT for right lower extremity for increased strength during ADL and community ambulation. Baseline: see objective Goal status: INITIAL  5.  Pt will improve 5TSTS by 2.3 seconds in order to improve functional strength during functional transfers. Baseline: see objective Goal status: INITIAL    PLAN:  PT FREQUENCY: 2x/week  PT DURATION: 6 weeks  PLANNED INTERVENTIONS: 97110-Therapeutic exercises, 97530- Therapeutic activity, 97112- Neuromuscular re-education, 97535- Self Care, 16109- Manual therapy, [phone]- Gait training, Patient/Family education, Balance training, Stair training, Joint mobilization, Scar mobilization, DME instructions, Cryotherapy, and Moist heat  PLAN FOR NEXT SESSION: progress protocol under media 1:18 PM, 04/25/24 Saquoia Sianez Small Ireta Pullman MPT [facility] [place] [phone] Ph:[phone]

## 2024-04-27 ENCOUNTER — Ambulatory Visit (HOSPITAL_COMMUNITY)

## 2024-04-27 ENCOUNTER — Encounter (HOSPITAL_COMMUNITY): Payer: Self-pay

## 2024-04-27 DIAGNOSIS — Z9889 Other specified postprocedural states: Secondary | ICD-10-CM

## 2024-04-27 DIAGNOSIS — Z7409 Other reduced mobility: Secondary | ICD-10-CM

## 2024-04-27 DIAGNOSIS — S82031D Displaced transverse fracture of right patella, subsequent encounter for closed fracture with routine healing: Secondary | ICD-10-CM | POA: Diagnosis not present

## 2024-04-27 NOTE — Therapy (Signed)
 OUTPATIENT PHYSICAL THERAPY LOWER EXTREMITY TREATMENT   Patient Name: Gabriella Holmes MRN: [id number] DOB:09-04-1951, 73 y.o., female Today's Date: 04/27/2024  END OF SESSION:  PT End of Session - 04/27/24 0942     Visit Number 4    Number of Visits 12    Date for PT Re-Evaluation 05/24/24    Authorization Type [employer]AM[employer]    Authorization Time Period no auth required    PT Start Time 0848    PT Stop Time 0930    PT Time Calculation (min) 42 min    Activity Tolerance Patient tolerated treatment well;Patient limited by pain    Behavior During Therapy [facility] for tasks assessed/performed           Past Medical History:  Diagnosis Date   GERD (gastroesophageal reflux disease)    High cholesterol    Hypertension    Type II diabetes mellitus (HCC)    Past Surgical History:  Procedure Laterality Date   BACK SURGERY     CARDIAC CATHETERIZATION  2001   CATARACT EXTRACTION W/ INTRAOCULAR LENS  IMPLANT, BILATERAL Bilateral ~ 2011   CHOLECYSTECTOMY  ~ 2012   COLONOSCOPY N/A 01/09/2014   Procedure: COLONOSCOPY;  Surgeon: Suzette Espy, MD;  Location: [facility];  Service: Endoscopy;  Laterality: N/A;  7:30 AM-rescheduled to 845 Doris notified pt   CYST EXCISION  1980's   from facial area.    EYE SURGERY     LUMBAR DISC SURGERY  1989   ORIF HUMERUS FRACTURE Right 09/21/2013   Procedure: OPEN REDUCTION INTERNAL FIXATION (ORIF) PROXIMAL HUMERUS FRACTURE;  Surgeon: Derald Flattery, MD;  Location: [facility] OR;  Service: Orthopedics;  Laterality: Right;  Right open reduction internal fixation humerus fracture   ORIF PROXIMAL HUMERUS FRACTURE Right 09/21/2013   PHOTOCOAGULATION WITH LASER Bilateral ~ 2011-2014   for diabetic retinopathy (09/21/2013)   TRIGGER FINGER RELEASE Left 1980's   TUBAL LIGATION  1975   Patient Active Problem List   Diagnosis Date Noted   Diabetic polyneuropathy (HCC) 01/26/2017   Hypercholesterolemia 01/26/2017   Hypertension, essential 01/26/2017   Type  2 diabetes mellitus with hyperglycemia, with long-term current use of insulin  (HCC) 01/26/2017   Vitamin B12 deficiency 01/26/2017   Vitamin D insufficiency 01/26/2017   Fracture of proximal humerus 09/07/2013   Humerus fracture 09/04/2013   Vitreous hemorrhage, right (HCC) 03/02/2013   Posterior vitreous detachment 06/27/2012   Pseudophakia 06/27/2012   Nuclear sclerotic cataract of both eyes 08/06/2011   Proliferative diabetic retinopathy of both eyes without macular edema associated with type 2 diabetes mellitus (HCC) 08/06/2011    PCP: Artemisa Bile, MD   REFERRING PROVIDER: Dalia Ducking, MD  REFERRING DIAG: S82.031A (ICD-10-CM) - Displaced transverse fracture of right patella, initial encounter for closed fracture  THERAPY DIAG:  S/P right knee surgery  Impaired functional mobility, balance, gait, and endurance  Rationale for Evaluation and Treatment: Rehabilitation  ONSET DATE: 04/04/24 (Surgery)  SUBJECTIVE:   SUBJECTIVE STATEMENT: No pain today, pt states she is still trying HEP to toleration.   May the 16th fell at the [facility] for husbands appointment. Fell in the hallway due to tripping over own feet. Pt also describes falling in 2014 and breaking her arm. Pt states she would like to improve her balance during therapy episode. Pt states she has a follow up appointment at [facility] Monday and will obtain protocol at that time.   PERTINENT HISTORY: Shoulder surgery due to another fall PAIN:  Are you having  pain? Yes: NPRS scale: 3-4/10 Pain location: front of right knee Pain description: dull, tight feeling Aggravating factors: tenderness Relieving factors: Advil and tylenol , ice, elevation  PRECAUTIONS: Knee and Fall  RED FLAGS: Pt states she has had stomach problems since before the surgery.   WEIGHT BEARING RESTRICTIONS: Yes WBAT per patient   FALLS:  Has patient fallen in last 6 months? Yes. 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  DIAGNOSTIC FINDINGS: none on chart  PATIENT SURVEYS:  LEFS: TBA  COGNITION: Overall cognitive status: Within functional limits for tasks assessed     SENSATION: WFL Not tested at incision  EDEMA:  In immobilizer, TBA when cleared immobilizer off  PALPATION: Pt still with surgical bandaging, slight bruising on front, increased bruising on the back  LOWER EXTREMITY ROM:  Active ROM Right eval Left eval  Hip flexion    Hip extension    Hip abduction    Hip adduction    Hip internal rotation    Hip external rotation    Knee flexion TBA TBA  Knee extension TBA TBA  Ankle dorsiflexion    Ankle plantarflexion    Ankle inversion    Ankle eversion     (Blank rows = not tested)  LOWER EXTREMITY MMT:  MMT Right eval Left eval  Hip flexion TBA 3+  Hip extension    Hip abduction    Hip adduction    Hip internal rotation    Hip external rotation    Knee flexion TBA 4-  Knee extension TBA 4-  Ankle dorsiflexion 4 4  Ankle plantarflexion    Ankle inversion    Ankle eversion     (Blank rows = not tested)    FUNCTIONAL TESTS:  5 times sit to stand: TBA, pt unable to get up with out assist 2 minute walk test: TBA, pt unable to complete due to need to sit down  GAIT: Distance walked: 75 feet in and out from treatment area Assistive device utilized: Walker - 2 wheeled Level of assistance: Modified independence Comments: Pt demonstrates antalgic gait pattern, could be influenced by 0 degree locking of immobilizing brace  on RLE. Decreased stride length and velocity noted.                                                                                                                                TREATMENT DATE:  04/27/2024  Manual Therapy: -Right patellar mobilizations, grade II and III in all planes -STM of RLE hamstrings, quads and surgical  incision -PROM of R knee to pt toleration Therapeutic Exercise: -Ankle Theraband, 2 sets of 10 reps, PF, EV, DF, INV, 1 set of 10 reps, therapist assist -Hip isometrics abduction/adduction, 1 set of 10, 5 second holds -Supine quad sets, 1 set of 10 reps, 5 seconds -Sit to stands, 2 sets of 4 reps, throughout session    04/25/24 Standing  Heel raises 2 x 10 Hip extension 2 x 10 right leg Hip abduction 2 x 10 right leg Supine: 5 hold x 10 quad set Heel slides 0-30 AAROM with PT and with strap x 10 total Ankle red theraband dorsiflexion, inversion and eversion x 10 each Updated HEP   04/20/24 LEFS 18/80 22.5% 2 MWT   133 ft with RW Review of HEP and goals 5 times sit to stand 1 min and 13 sec using hands to assist up to standing to RW Supine: Quad sets 5 hold x 10 Patella mobs gentle x 10 each way Updated HEP  04/12/2024   Evaluation: -ROM measured, Strength assessed, HEP prescribed, pt educated on prognosis, findings, and importance of HEP compliance if given.  Therapeutic Exercise: -Standing 3 way hip 1 sets 5 reps, RLE only, pt cued for upright trunk and maintaining of neutral spine        PATIENT EDUCATION:  Education details: Pt was educated on findings of PT evaluation, prognosis, frequency of therapy visits and rationale, attendance policy, and HEP if given.   Person educated: Patient and Spouse Education method: Explanation, Verbal cues, and Handouts Education comprehension: verbalized understanding, tactile cues required, and needs further education  HOME EXERCISE PROGRAM: Access Code: [email or web address] URL:  [email or web address] Date: 04/25/2024 Prepared by: [facility] - Rehab  Exercises - Supine Quadricep Sets  - 2 x daily - 7 x weekly - 1 sets - 10 reps - 5 sec hold - gentle 4 way patellar glides  - 2 x daily - 7 x weekly - 1 sets - 10 reps - Standing Heel Raise with Support  - 2 x daily - 7 x weekly - 2 sets - 10 reps - Supine Heel Slide with Strap  - 2 x daily - 7 x weekly - 1 sets - 10 reps - to 30 degrees only hold - Long Sitting Ankle Plantar Flexion with Resistance  - 2 x daily - 7 x weekly - 2 sets - 10 reps - Long Sitting Ankle Eversion with Resistance  - 2 x daily - 7 x weekly - 2 sets - 10 reps - Long Sitting Ankle Inversion with Resistance  - 2 x daily - 7 x weekly - 2 sets - 10 reps - Ankle Inversion with Resistance  - 2 x daily - 7 x weekly - 2 sets - 10 reps Access Code: [email or web address] URL: [email or web address] Date: 04/12/2024 Prepared by: Armond Bertin  Exercises - Standing 3-way Hip with Otho Blitz  - 1 x daily - 7 x weekly - 3 sets - 10 reps - Seated Quad Set  - 1 x daily - 7 x weekly - 3 sets - 10 reps  ASSESSMENT:  CLINICAL IMPRESSION: Patient continues to demonstrate decreased RLE strength/mobility, decreased gait quality and balance. Patient also demonstrates good toleration to manual therapy although pain limited in PROM knee flexion, tightness reported. Patient able to progress ankle and hip activation exercises today with ankle theraband and hip isometrics, good performance with verbal cueing. Patient would continue to benefit from skilled physical therapy for increased endurance with ambulation, increased RLE strength, increased R knee mobility and  improved balance for improved quality of life, improved independence with gait training and continued progress towards therapy goals.      Eval:Patient is a 73 y.o. female who was seen today for physical therapy evaluation and treatment for S82.031A (ICD-10-CM) - Displaced transverse fracture of right patella,  initial encounter for closed fracture.   Patient demonstrates decreased RLE strength, abnormal pain rating, impaired functional mobility, difficulty with ambulation and impaired balance. Patient demonstrates difficulty with ambulation during today's session with heavy dependence on UEs, decreased stride length and velocity noted. Patient also demonstrates difficulty getting out of the car and requires assist of therapist, pt able to walk into clinic with modified independence, RW. Patient requires education on role of PT, plan of care, HEP and fall risk. Pt was not taken out of knee immobilizing brace due to lack of protocol provided and HEP was prescribed on a pain free basis. Patient would benefit from skilled physical therapy for increased endurance with ambulation, increased RLE strength, improved functional mobility and improved balance for improved gait quality, return to higher level of function with ADLs, return to driving, and progress towards therapy goals.   OBJECTIVE IMPAIRMENTS: Abnormal gait, decreased activity tolerance, decreased balance, decreased endurance, decreased knowledge of use of DME, decreased mobility, difficulty walking, decreased ROM, decreased strength, increased edema, and pain.   ACTIVITY LIMITATIONS: carrying, lifting, bending, sitting, standing, squatting, stairs, transfers, bed mobility, bathing, toileting, locomotion level, and driving  PARTICIPATION LIMITATIONS: meal prep, cleaning, laundry, driving, shopping, community activity, occupation, and church  PERSONAL FACTORS: Age, Fitness, Past/current experiences, Time since onset of injury/illness/exacerbation, and 1 comorbidity: history of falling are also affecting patient's functional outcome.   REHAB POTENTIAL: Good  CLINICAL DECISION MAKING: Stable/uncomplicated  EVALUATION COMPLEXITY: Low   GOALS: Goals reviewed with patient? No  SHORT TERM GOALS: Target date: 05/03/24  Patient will demonstrate  evidence of independence with individualized HEP and will report compliance for at least 3 days per week for optimized progression towards remaining therapy goals. Baseline:  Goal status: INITIAL  2.  Patient will report a decrease in pain level during community ambulation by at least 2 points for improved quality of life. Baseline: 3/10 Goal status: INITIAL     LONG TERM GOALS: Target date: 05/24/24  Pt will demonstrate a an increase of at least 9 points on the LEFS for improved performance of community ambulation and ADL. Baseline: see obective Goal status: INITIAL  2.  Pt will improve 2 MWT by 140 in order to demonstrate improved functional ambulatory capacity in community setting.  Baseline: 133 ft with RW Goal status: INITIAL  3.  Pt will demonstrate WFL ROM (flexion and extension) in right knee, for increased mobility and maximal efficiency of gait cycle during ambulation. Baseline: protocol, pt locked at 0 degrees upon presenting to evaluation Goal status: INITIAL  4.  Pt will demonstrate at least 4/5 MMT for right lower extremity for increased strength during ADL and community ambulation. Baseline: see objective Goal status: INITIAL  5.  Pt will improve 5TSTS by 2.3 seconds in order to improve functional strength during functional transfers. Baseline: see objective Goal status: INITIAL    PLAN:  PT FREQUENCY: 2x/week  PT DURATION: 6 weeks  PLANNED INTERVENTIONS: 97110-Therapeutic exercises, 97530- Therapeutic activity, 97112- Neuromuscular re-education, 97535- Self Care, 16109- Manual therapy, [phone]- Gait training, Patient/Family education, Balance training, Stair training, Joint mobilization, Scar mobilization, DME instructions, Cryotherapy, and Moist heat  PLAN FOR NEXT SESSION: progress protocol under media  Armond Bertin, PT,  DPT [facility] Office: [phone] 9:48 AM, 04/27/24

## 2024-05-04 ENCOUNTER — Encounter (HOSPITAL_COMMUNITY): Payer: Self-pay

## 2024-05-04 ENCOUNTER — Ambulatory Visit (HOSPITAL_COMMUNITY)

## 2024-05-04 DIAGNOSIS — Z7409 Other reduced mobility: Secondary | ICD-10-CM

## 2024-05-04 DIAGNOSIS — S82031D Displaced transverse fracture of right patella, subsequent encounter for closed fracture with routine healing: Secondary | ICD-10-CM | POA: Diagnosis not present

## 2024-05-04 DIAGNOSIS — Z9889 Other specified postprocedural states: Secondary | ICD-10-CM

## 2024-05-04 NOTE — Therapy (Signed)
 OUTPATIENT PHYSICAL THERAPY LOWER EXTREMITY TREATMENT   Patient Name: Gabriella Holmes MRN: [id number] DOB:1951/07/04, 73 y.o., female Today's Date: 05/04/2024  END OF SESSION:  PT End of Session - 05/04/24 0849     Visit Number 5    Number of Visits 12    Date for PT Re-Evaluation 05/24/24    Authorization Type CHAM[employer]    Authorization Time Period no auth required    PT Start Time 0849    PT Stop Time 0930    PT Time Calculation (min) 41 min    Activity Tolerance Patient tolerated treatment well;Patient limited by pain    Behavior During Therapy [facility] for tasks assessed/performed            Past Medical History:  Diagnosis Date   GERD (gastroesophageal reflux disease)    High cholesterol    Hypertension    Type II diabetes mellitus (HCC)    Past Surgical History:  Procedure Laterality Date   BACK SURGERY     CARDIAC CATHETERIZATION  2001   CATARACT EXTRACTION W/ INTRAOCULAR LENS  IMPLANT, BILATERAL Bilateral ~ 2011   CHOLECYSTECTOMY  ~ 2012   COLONOSCOPY N/A 01/09/2014   Procedure: COLONOSCOPY;  Surgeon: Lamar CHRISTELLA Hollingshead, MD;  Location: [facility];  Service: Endoscopy;  Laterality: N/A;  7:30 AM-rescheduled to [address] notified pt   CYST EXCISION  1980's   from facial area.    EYE SURGERY     LUMBAR DISC SURGERY  1989   ORIF HUMERUS FRACTURE Right 09/21/2013   Procedure: OPEN REDUCTION INTERNAL FIXATION (ORIF) PROXIMAL HUMERUS FRACTURE;  Surgeon: Eva Elsie Herring, MD;  Location: [facility] OR;  Service: Orthopedics;  Laterality: Right;  Right open reduction internal fixation humerus fracture   ORIF PROXIMAL HUMERUS FRACTURE Right 09/21/2013   PHOTOCOAGULATION WITH LASER Bilateral ~ 2011-2014   for diabetic retinopathy (09/21/2013)   TRIGGER FINGER RELEASE Left 1980's   TUBAL LIGATION  1975   Patient Active Problem List   Diagnosis Date Noted   Diabetic polyneuropathy (HCC) 01/26/2017   Hypercholesterolemia 01/26/2017   Hypertension, essential 01/26/2017    Type 2 diabetes mellitus with hyperglycemia, with long-term current use of insulin  (HCC) 01/26/2017   Vitamin B12 deficiency 01/26/2017   Vitamin D insufficiency 01/26/2017   Fracture of proximal humerus 09/07/2013   Humerus fracture 09/04/2013   Vitreous hemorrhage, right (HCC) 03/02/2013   Posterior vitreous detachment 06/27/2012   Pseudophakia 06/27/2012   Nuclear sclerotic cataract of both eyes 08/06/2011   Proliferative diabetic retinopathy of both eyes without macular edema associated with type 2 diabetes mellitus (HCC) 08/06/2011    PCP: Sheryle Carwin, MD   REFERRING PROVIDER: Adair Lamar Hummer, MD  REFERRING DIAG: S82.031A (ICD-10-CM) - Displaced transverse fracture of right patella, initial encounter for closed fracture  THERAPY DIAG:  S/P right knee surgery  Impaired functional mobility, balance, gait, and endurance  Rationale for Evaluation and Treatment: Rehabilitation  ONSET DATE: 04/04/24 (Surgery)  SUBJECTIVE:   SUBJECTIVE STATEMENT: Pt states she was able to walk around [employer].  No pain today, pt states she is still trying HEP to toleration.   May the 16th fell at the [facility] for husbands appointment. Fell in the hallway due to tripping over own feet. Pt also describes falling in 2014 and breaking her arm. Pt states she would like to improve her balance during therapy episode. Pt states she has a follow up appointment at [facility] Monday and will obtain protocol at that time.   PERTINENT  HISTORY: Shoulder surgery due to another fall PAIN:  Are you having pain? Yes: NPRS scale: 3-4/10 Pain location: front of right knee Pain description: dull, tight feeling Aggravating factors: tenderness Relieving factors: Advil and tylenol , ice, elevation  PRECAUTIONS: Knee and Fall  RED FLAGS: Pt states she has had stomach problems since before the surgery.   WEIGHT BEARING RESTRICTIONS: Yes WBAT per patient   FALLS:  Has patient fallen in last 6 months?  Yes. 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  DIAGNOSTIC FINDINGS: none on chart  PATIENT SURVEYS:  LEFS: TBA  COGNITION: Overall cognitive status: Within functional limits for tasks assessed     SENSATION: WFL Not tested at incision  EDEMA:  In immobilizer, TBA when cleared immobilizer off  PALPATION: Pt still with surgical bandaging, slight bruising on front, increased bruising on the back  LOWER EXTREMITY ROM:  Active ROM Right eval Left eval  Hip flexion    Hip extension    Hip abduction    Hip adduction    Hip internal rotation    Hip external rotation    Knee flexion TBA TBA  Knee extension TBA TBA  Ankle dorsiflexion    Ankle plantarflexion    Ankle inversion    Ankle eversion     (Blank rows = not tested)  LOWER EXTREMITY MMT:  MMT Right eval Left eval  Hip flexion TBA 3+  Hip extension    Hip abduction    Hip adduction    Hip internal rotation    Hip external rotation    Knee flexion TBA 4-  Knee extension TBA 4-  Ankle dorsiflexion 4 4  Ankle plantarflexion    Ankle inversion    Ankle eversion     (Blank rows = not tested)    FUNCTIONAL TESTS:  5 times sit to stand: 37.63 seconds 2 minute walk test: 215 feet, RW and with knee brace  GAIT: Distance walked: 75 feet in and out from treatment area Assistive device utilized: Environmental consultant - 2 wheeled Level of assistance: Modified independence Comments: Pt demonstrates antalgic gait pattern, could be influenced by 0 degree locking of  immobilizing brace on RLE. Decreased stride length and velocity noted.                                                                                                                                TREATMENT DATE:  05/04/2024  Therapeutic Exercise: - -5TSTS -Hip isometrics abduction/adduction, 2 set of 10, 5 second holds -Heel slides, 2 sets  of 20 reps, gait belt for AAROM -Small range SLR, 1 set of 10 reps, pt cued for quad set and only lifting a few inches off table -Supine quad sets, 1 set of 10 reps, 5 seconds   04/27/2024  Manual Therapy: -Right patellar mobilizations, grade II and III in all planes -STM of RLE hamstrings, quads and surgical incision -PROM of R knee to pt toleration Therapeutic Exercise: -Ankle Theraband, 2 sets of 10 reps, PF, EV, DF, INV, 1 set of 10 reps, therapist assist -Hip isometrics abduction/adduction, 1 set of 10, 5 second holds -Supine quad sets, 1 set of 10 reps, 5 seconds -Sit to stands, 2 sets of 4 reps, throughout session    04/25/24 Standing  Heel raises 2 x 10 Hip extension 2 x 10 right leg Hip abduction 2 x 10 right leg Supine: 5 hold x 10 quad set Heel slides 0-30 AAROM with PT and with strap x 10 total Ankle red theraband dorsiflexion, inversion and eversion x 10 each Updated HEP      PATIENT EDUCATION:  Education details: Pt was educated on findings of PT evaluation, prognosis, frequency of therapy visits and rationale, attendance policy, and HEP if given.   Person educated: Patient and Spouse Education method: Explanation, Verbal cues, and Handouts Education comprehension: verbalized understanding, tactile cues required, and needs further education  HOME EXERCISE PROGRAM: Access Code: [email or web address] URL: [email or web address] Date: 04/25/2024 Prepared by: [facility]  Exercises - Supine Quadricep Sets  - 2 x daily - 7 x weekly - 1 sets - 10 reps - 5 sec hold - gentle 4 way patellar glides  - 2 x daily - 7 x  weekly - 1 sets - 10 reps - Standing Heel Raise with Support  - 2 x daily - 7 x weekly - 2 sets - 10 reps - Supine Heel Slide with Strap  - 2 x daily - 7 x weekly - 1 sets - 10 reps - to 30 degrees only hold - Long Sitting Ankle Plantar Flexion with Resistance  - 2 x daily - 7 x weekly - 2 sets - 10 reps - Long Sitting Ankle Eversion with Resistance  - 2 x daily - 7 x weekly - 2 sets - 10 reps - Long Sitting Ankle Inversion with Resistance  - 2 x daily - 7 x weekly - 2 sets - 10 reps - Ankle Inversion with Resistance  - 2 x daily - 7 x weekly - 2 sets - 10 reps Access Code: [email or web address] URL: [email or web address] Date: 04/12/2024 Prepared by: Lang Ada  Exercises - Standing 3-way Hip with Vannie  - 1 x daily - 7 x weekly - 3 sets - 10 reps - Seated Quad Set  - 1 x daily - 7 x weekly - 3 sets - 10 reps  ASSESSMENT:  CLINICAL IMPRESSION: Patient continues to demonstrate no pain in RLE, decreased LE strength, decreased gait quality and balance. Patient also demonstrates increased endurance with 2 minute walk test during today's session. Patient able to progress dynamic balance and core activation exercises today with small range SLR and heel slides to about 40 degrees without pain. Patient would continue to benefit from skilled physical therapy for increased endurance with ambulation, increased RLE strength, and improved balance for improved quality of life, improved independence with gait training and continued progress towards therapy goals.       Eval:Patient is a 73 y.o. female who was seen today for physical therapy evaluation and treatment for S82.031A (  ICD-10-CM) - Displaced transverse fracture of right patella, initial encounter for closed fracture.   Patient demonstrates decreased RLE strength, abnormal pain rating, impaired functional mobility, difficulty with ambulation and impaired balance. Patient demonstrates difficulty with ambulation during today's session with  heavy dependence on UEs, decreased stride length and velocity noted. Patient also demonstrates difficulty getting out of the car and requires assist of therapist, pt able to walk into clinic with modified independence, RW. Patient requires education on role of PT, plan of care, HEP and fall risk. Pt was not taken out of knee immobilizing brace due to lack of protocol provided and HEP was prescribed on a pain free basis. Patient would benefit from skilled physical therapy for increased endurance with ambulation, increased RLE strength, improved functional mobility and improved balance for improved gait quality, return to higher level of function with ADLs, return to driving, and progress towards therapy goals.   OBJECTIVE IMPAIRMENTS: Abnormal gait, decreased activity tolerance, decreased balance, decreased endurance, decreased knowledge of use of DME, decreased mobility, difficulty walking, decreased ROM, decreased strength, increased edema, and pain.   ACTIVITY LIMITATIONS: carrying, lifting, bending, sitting, standing, squatting, stairs, transfers, bed mobility, bathing, toileting, locomotion level, and driving  PARTICIPATION LIMITATIONS: meal prep, cleaning, laundry, driving, shopping, community activity, occupation, and church  PERSONAL FACTORS: Age, Fitness, Past/current experiences, Time since onset of injury/illness/exacerbation, and 1 comorbidity: history of falling are also affecting patient's functional outcome.   REHAB POTENTIAL: Good  CLINICAL DECISION MAKING: Stable/uncomplicated  EVALUATION COMPLEXITY: Low   GOALS: Goals reviewed with patient? No  SHORT TERM GOALS: Target date: 05/03/24  Patient will demonstrate evidence of independence with individualized HEP and will report compliance for at least 3 days per week for optimized progression towards remaining therapy goals. Baseline:  Goal status: INITIAL  2.  Patient will report a decrease in pain level during community  ambulation by at least 2 points for improved quality of life. Baseline: 3/10 Goal status: INITIAL     LONG TERM GOALS: Target date: 05/24/24  Pt will demonstrate a an increase of at least 9 points on the LEFS for improved performance of community ambulation and ADL. Baseline: see obective Goal status: INITIAL  2.  Pt will improve 2 MWT by 140 in order to demonstrate improved functional ambulatory capacity in community setting.  Baseline: 133 ft with RW Goal status: INITIAL  3.  Pt will demonstrate WFL ROM (flexion and extension) in right knee, for increased mobility and maximal efficiency of gait cycle during ambulation. Baseline: protocol, pt locked at 0 degrees upon presenting to evaluation Goal status: INITIAL  4.  Pt will demonstrate at least 4/5 MMT for right lower extremity for increased strength during ADL and community ambulation. Baseline: see objective Goal status: INITIAL  5.  Pt will improve 5TSTS by 2.3 seconds in order to improve functional strength during functional transfers. Baseline: see objective Goal status: INITIAL    PLAN:  PT FREQUENCY: 2x/week  PT DURATION: 6 weeks  PLANNED INTERVENTIONS: 97110-Therapeutic exercises, 97530- Therapeutic activity, 97112- Neuromuscular re-education, 97535- Self Care, [id number]- Manual therapy, [phone]- Gait training, Patient/Family education, Balance training, Stair training, Joint mobilization, Scar mobilization, DME instructions, Cryotherapy, and Moist heat  PLAN FOR NEXT SESSION: progress protocol under media  Lang Ada, PT, DPT [facility] Office: [phone] 10:32 AM, 05/04/24

## 2024-05-09 ENCOUNTER — Ambulatory Visit (HOSPITAL_COMMUNITY): Admitting: Physical Therapy

## 2024-05-09 DIAGNOSIS — Z9889 Other specified postprocedural states: Secondary | ICD-10-CM | POA: Insufficient documentation

## 2024-05-09 DIAGNOSIS — Z7409 Other reduced mobility: Secondary | ICD-10-CM | POA: Insufficient documentation

## 2024-05-09 DIAGNOSIS — R2689 Other abnormalities of gait and mobility: Secondary | ICD-10-CM | POA: Insufficient documentation

## 2024-05-09 NOTE — Therapy (Signed)
 OUTPATIENT PHYSICAL THERAPY LOWER EXTREMITY TREATMENT   Patient Name: Gabriella Holmes MRN: [id number] DOB:1951/07/08, 73 y.o., female Today's Date: 05/09/2024  END OF SESSION:  PT End of Session - 05/09/24 1052     Visit Number 6    Number of Visits 12    Date for PT Re-Evaluation 05/24/24    Authorization Type CHAM[employer]    Authorization Time Period no auth required    PT Start Time 0935    PT Stop Time 1015    PT Time Calculation (min) 40 min    Activity Tolerance Patient tolerated treatment well;Patient limited by pain    Behavior During Therapy [facility] for tasks assessed/performed             Past Medical History:  Diagnosis Date   GERD (gastroesophageal reflux disease)    High cholesterol    Hypertension    Type II diabetes mellitus (HCC)    Past Surgical History:  Procedure Laterality Date   BACK SURGERY     CARDIAC CATHETERIZATION  2001   CATARACT EXTRACTION W/ INTRAOCULAR LENS  IMPLANT, BILATERAL Bilateral ~ 2011   CHOLECYSTECTOMY  ~ 2012   COLONOSCOPY N/A 01/09/2014   Procedure: COLONOSCOPY;  Surgeon: Lamar CHRISTELLA Hollingshead, MD;  Location: [facility];  Service: Endoscopy;  Laterality: N/A;  7:30 AM-rescheduled to [address] notified pt   CYST EXCISION  1980's   from facial area.    EYE SURGERY     LUMBAR DISC SURGERY  1989   ORIF HUMERUS FRACTURE Right 09/21/2013   Procedure: OPEN REDUCTION INTERNAL FIXATION (ORIF) PROXIMAL HUMERUS FRACTURE;  Surgeon: Eva Elsie Herring, MD;  Location: [facility] OR;  Service: Orthopedics;  Laterality: Right;  Right open reduction internal fixation humerus fracture   ORIF PROXIMAL HUMERUS FRACTURE Right 09/21/2013   PHOTOCOAGULATION WITH LASER Bilateral ~ 2011-2014   for diabetic retinopathy (09/21/2013)   TRIGGER FINGER RELEASE Left 1980's   TUBAL LIGATION  1975   Patient Active Problem List   Diagnosis Date Noted   Diabetic polyneuropathy (HCC) 01/26/2017   Hypercholesterolemia 01/26/2017   Hypertension, essential 01/26/2017    Type 2 diabetes mellitus with hyperglycemia, with long-term current use of insulin  (HCC) 01/26/2017   Vitamin B12 deficiency 01/26/2017   Vitamin D insufficiency 01/26/2017   Fracture of proximal humerus 09/07/2013   Humerus fracture 09/04/2013   Vitreous hemorrhage, right (HCC) 03/02/2013   Posterior vitreous detachment 06/27/2012   Pseudophakia 06/27/2012   Nuclear sclerotic cataract of both eyes 08/06/2011   Proliferative diabetic retinopathy of both eyes without macular edema associated with type 2 diabetes mellitus (HCC) 08/06/2011    PCP: Sheryle Carwin, MD   REFERRING PROVIDER: Adair Lamar Hummer, MD  REFERRING DIAG: S82.031A (ICD-10-CM) - Displaced transverse fracture of right patella, initial encounter for closed fracture  THERAPY DIAG:  S/P right knee surgery  Impaired functional mobility, balance, gait, and endurance  Rationale for Evaluation and Treatment: Rehabilitation  ONSET DATE: 04/04/24 (Surgery)  SUBJECTIVE:   SUBJECTIVE STATEMENT: Pt states she is getting better, using her RW still and has her brace donned.  Returns to MD tomorrow.     Evaluation: May the 16th fell at the [facility] for husbands appointment. Fell in the hallway due to tripping over own feet. Pt also describes falling in 2014 and breaking her arm. Pt states she would like to improve her balance during therapy episode. Pt states she has a follow up appointment at [facility] hospital Monday and will obtain protocol at that time.  PERTINENT HISTORY: Shoulder surgery due to another fall  PAIN:  Are you having pain? Yes: NPRS scale: 3-4/10 Pain location: front of right knee Pain description: dull, tight feeling Aggravating factors: tenderness Relieving factors: Advil and tylenol , ice, elevation  PRECAUTIONS: Knee, Fall, and Other: PROTOCOL UNDER MEDIA  RED FLAGS: Pt states she has had stomach problems since before the surgery.   WEIGHT BEARING RESTRICTIONS: Yes WBAT per patient   FALLS:  Has  patient fallen in last 6 months? Yes. 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  DIAGNOSTIC FINDINGS: none on chart  PATIENT SURVEYS:  LEFS: TBA  COGNITION: Overall cognitive status: Within functional limits for tasks assessed     SENSATION: [facility] Not tested at incision  EDEMA:  In immobilizer, TBA when cleared immobilizer off  PALPATION: Pt still with surgical bandaging, slight bruising on front, increased bruising on the back  LOWER EXTREMITY ROM:  Active ROM Right eval Left eval Right  05/09/24  Hip flexion     Hip extension     Hip abduction     Hip adduction     Hip internal rotation     Hip external rotation     Knee flexion TBA TBA 65  Knee extension TBA TBA 0  Ankle dorsiflexion     Ankle plantarflexion     Ankle inversion     Ankle eversion      (Blank rows = not tested)  LOWER EXTREMITY MMT:  MMT Right eval Left eval  Hip flexion TBA 3+  Hip extension    Hip abduction    Hip adduction    Hip internal rotation    Hip external rotation    Knee flexion TBA 4-  Knee extension TBA 4-  Ankle dorsiflexion 4 4  Ankle plantarflexion    Ankle inversion    Ankle eversion     (Blank rows = not tested)    FUNCTIONAL TESTS:  5 times sit to stand: 37.63 seconds 2 minute walk test: 215 feet, RW and with knee brace  GAIT: Distance walked: 75 feet in and out from treatment area Assistive device utilized: Walker - 2 wheeled Level of assistance: Modified independence Comments: Pt demonstrates antalgic  gait pattern, could be influenced by 0 degree locking of immobilizing brace on RLE. Decreased stride length and velocity noted.                                                                                                                                TREATMENT DATE:  05/09/24 Supine:  no brace Manual to reduce adhesions; myofascial retrictions Heelslides 0-60, 0-65 Hip adduction with blue ball 10X5 Hip abduction with RTB 10X5 holds 1/2 roll SAQ 2X10 with 5 holds   05/04/2024  Therapeutic Exercise: - -5TSTS -Hip isometrics abduction/adduction, 2 set of 10, 5 second holds -Heel slides, 2 sets of 20 reps, gait belt for AAROM -Small range SLR, 1 set of 10 reps, pt cued for quad set and only lifting a few inches off table -Supine quad sets, 1 set of 10 reps, 5 seconds   04/27/2024  Manual Therapy: -Right patellar mobilizations, grade II and III in all planes -STM of RLE hamstrings, quads and surgical incision -PROM of R knee to pt toleration Therapeutic Exercise: -Ankle Theraband, 2 sets of 10 reps, PF, EV, DF, INV, 1 set of 10 reps, therapist assist -Hip isometrics abduction/adduction, 1 set of 10, 5 second holds -Supine quad sets, 1 set of 10 reps, 5 seconds -Sit to stands, 2 sets of 4 reps, throughout session    04/25/24 Standing  Heel raises 2 x 10 Hip extension 2 x 10 right leg Hip abduction 2 x 10 right leg Supine: 5 hold x 10 quad set Heel slides 0-30 AAROM with PT and with strap x 10 total Ankle red theraband dorsiflexion, inversion and eversion x 10 each Updated HEP      PATIENT EDUCATION:  Education details: Pt was educated on findings of PT evaluation, prognosis, frequency of therapy visits and rationale, attendance policy, and HEP if given.   Person educated: Patient and Spouse Education method: Explanation, Verbal cues, and Handouts Education comprehension: verbalized understanding, tactile cues required, and needs further education  HOME  EXERCISE PROGRAM: Access Code: [email or web address] URL: [email or web address] Date: 04/25/2024 Prepared by: [facility] - [facility]  Exercises - Supine Quadricep Sets  - 2 x daily - 7 x weekly - 1 sets - 10 reps - 5 sec hold - gentle 4 way patellar glides  - 2 x daily - 7 x weekly - 1 sets - 10 reps - Standing Heel Raise with Support  - 2 x daily - 7 x weekly - 2 sets - 10 reps - Supine Heel Slide with Strap  - 2 x daily - 7 x weekly - 1 sets - 10 reps - to 30 degrees only hold - Long Sitting Ankle Plantar Flexion with Resistance  - 2 x daily - 7 x weekly - 2 sets - 10 reps - Long Sitting Ankle Eversion with Resistance  - 2 x daily - 7 x weekly - 2 sets - 10 reps - Long Sitting Ankle Inversion with Resistance  - 2 x daily - 7 x weekly - 2 sets - 10 reps - Ankle Inversion with Resistance  - 2 x daily - 7 x weekly - 2 sets - 10 reps Access Code: [email or web address] URL: [email or web address] Date: 04/12/2024 Prepared by: Lang Ada  Exercises - Standing 3-way Hip with Vannie  - 1 x daily - 7 x weekly - 3 sets - 10 reps - Seated Quad Set  - 1 x daily - 7 x weekly - 3 sets - 10 reps  ASSESSMENT:  CLINICAL IMPRESSION: Continued per protocol, currently in week 5, phase II.  Completed exercises in supine without brace donned.  Began with manual to tight adhesions, especially distal knee and patellar mobilizations.   Much improved following manual with ability to achieve 60 degrees flexion before and 65 degrees following manual.  Hip adduction using ball with knees bent approx  60 degrees as well as hip abduction with red theraband resistance at 60 degrees completed today.  Adjusted brace as top 2 straps were too long to keep brace tight enough not to slide.  Pt reported overall improvement in fit following.  Pt is progressing well with protocol.  Explained she needs to be at 110 degrees flexion by week 8.   Patient would continue to benefit from skilled physical therapy for increased endurance with  ambulation, increased RLE strength, and improved balance for improved quality of life, improved independence with gait training and continued progress towards therapy goals.      Eval:Patient is a 73 y.o. female who was seen today for physical therapy evaluation and treatment for S82.031A (ICD-10-CM) - Displaced transverse fracture of right patella, initial encounter for closed fracture.   Patient demonstrates decreased RLE strength, abnormal pain rating, impaired functional mobility, difficulty with ambulation and impaired balance. Patient demonstrates difficulty with ambulation during today's session with heavy dependence on UEs, decreased stride length and velocity noted. Patient also demonstrates difficulty getting out of the car and requires assist of therapist, pt able to walk into clinic with modified independence, RW. Patient requires education on role of PT, plan of care, HEP and fall risk. Pt was not taken out of knee immobilizing brace due to lack of protocol provided and HEP was prescribed on a pain free basis. Patient would benefit from skilled physical therapy for increased endurance with ambulation, increased RLE strength, improved functional mobility and improved balance for improved gait quality, return to higher level of function with ADLs, return to driving, and progress towards therapy goals.   OBJECTIVE IMPAIRMENTS: Abnormal gait, decreased activity tolerance, decreased balance, decreased endurance, decreased knowledge of use of DME, decreased mobility, difficulty walking, decreased ROM, decreased strength, increased edema, and pain.   ACTIVITY LIMITATIONS: carrying, lifting, bending, sitting, standing, squatting, stairs, transfers, bed mobility, bathing, toileting, locomotion level, and driving  PARTICIPATION LIMITATIONS: meal prep, cleaning, laundry, driving, shopping, community activity, occupation, and church  PERSONAL FACTORS: Age, Fitness, Past/current experiences, Time  since onset of injury/illness/exacerbation, and 1 comorbidity: history of falling are also affecting patient's functional outcome.   REHAB POTENTIAL: Good  CLINICAL DECISION MAKING: Stable/uncomplicated  EVALUATION COMPLEXITY: Low   GOALS: Goals reviewed with patient? No  SHORT TERM GOALS: Target date: 05/03/24  Patient will demonstrate evidence of independence with individualized HEP and will report compliance for at least 3 days per week for optimized progression towards remaining therapy goals. Baseline:  Goal status: INITIAL  2.  Patient will report a decrease in pain level during community ambulation by at least 2 points for improved quality of life. Baseline: 3/10 Goal status: INITIAL     LONG TERM GOALS: Target date: 05/24/24  Pt will demonstrate a an increase of at least 9 points on the LEFS for improved performance of community ambulation and ADL. Baseline: see obective Goal status: INITIAL  2.  Pt will improve 2 MWT by 140 in order to demonstrate improved functional ambulatory capacity in community setting.  Baseline: 133 ft with RW Goal status: INITIAL  3.  Pt will demonstrate WFL ROM (flexion and extension) in right knee, for increased mobility and maximal efficiency of gait cycle during ambulation. Baseline: protocol, pt locked at 0 degrees upon presenting to evaluation Goal status: INITIAL  4.  Pt will demonstrate at least 4/5 MMT for right lower extremity for increased strength during ADL and community ambulation. Baseline: see objective Goal status: INITIAL  5.  Pt will  improve 5TSTS by 2.3 seconds in order to improve functional strength during functional transfers. Baseline: see objective Goal status: INITIAL    PLAN:  PT FREQUENCY: 2x/week  PT DURATION: 6 weeks  PLANNED INTERVENTIONS: 97110-Therapeutic exercises, 97530- Therapeutic activity, 97112- Neuromuscular re-education, 97535- Self Care, [id number]- Manual therapy, [phone]- Gait training,  Patient/Family education, Balance training, Stair training, Joint mobilization, Scar mobilization, DME instructions, Cryotherapy, and Moist heat  PLAN FOR NEXT SESSION: progress protocol under media.  Next week will be 6 weeks and will be ready to move to Phase III of protocol.  May add straight leg raises, minisquats and bike.  Continue with standing exercise progression.  F/U with MD appt with possible discharge of AD and/or brace.  Greig KATHEE Fuse, PTA/CLT [facility] Ph: [phone] 10:52 AM, 05/09/24

## 2024-05-11 ENCOUNTER — Encounter (HOSPITAL_COMMUNITY): Payer: Self-pay

## 2024-05-11 ENCOUNTER — Ambulatory Visit (HOSPITAL_COMMUNITY)

## 2024-05-11 DIAGNOSIS — Z9889 Other specified postprocedural states: Secondary | ICD-10-CM | POA: Diagnosis not present

## 2024-05-11 DIAGNOSIS — Z7409 Other reduced mobility: Secondary | ICD-10-CM

## 2024-05-11 NOTE — Therapy (Signed)
 OUTPATIENT PHYSICAL THERAPY LOWER EXTREMITY TREATMENT   Patient Name: Gabriella Holmes MRN: [id number] DOB:Aug 13, 1951, 73 y.o., female Today's Date: 05/11/2024  END OF SESSION:  PT End of Session - 05/11/24 1022     Visit Number 7    Number of Visits 12    Date for PT Re-Evaluation 05/24/24    Authorization Type CHAM[employer]    Authorization Time Period no auth required    PT Start Time 1020    PT Stop Time 1100    PT Time Calculation (min) 40 min    Activity Tolerance Patient tolerated treatment well;Patient limited by pain    Behavior During Therapy [facility] for tasks assessed/performed              Past Medical History:  Diagnosis Date   GERD (gastroesophageal reflux disease)    High cholesterol    Hypertension    Type II diabetes mellitus (HCC)    Past Surgical History:  Procedure Laterality Date   BACK SURGERY     CARDIAC CATHETERIZATION  2001   CATARACT EXTRACTION W/ INTRAOCULAR LENS  IMPLANT, BILATERAL Bilateral ~ 2011   CHOLECYSTECTOMY  ~ 2012   COLONOSCOPY N/A 01/09/2014   Procedure: COLONOSCOPY;  Surgeon: Lamar CHRISTELLA Hollingshead, MD;  Location: [facility];  Service: Endoscopy;  Laterality: N/A;  7:30 AM-rescheduled to [address] notified pt   CYST EXCISION  1980's   from facial area.    EYE SURGERY     LUMBAR DISC SURGERY  1989   ORIF HUMERUS FRACTURE Right 09/21/2013   Procedure: OPEN REDUCTION INTERNAL FIXATION (ORIF) PROXIMAL HUMERUS FRACTURE;  Surgeon: Eva Elsie Herring, MD;  Location: [facility] OR;  Service: Orthopedics;  Laterality: Right;  Right open reduction internal fixation humerus fracture   ORIF PROXIMAL HUMERUS FRACTURE Right 09/21/2013   PHOTOCOAGULATION WITH LASER Bilateral ~ 2011-2014   for diabetic retinopathy (09/21/2013)   TRIGGER FINGER RELEASE Left 1980's   TUBAL LIGATION  1975   Patient Active Problem List   Diagnosis Date Noted   Diabetic polyneuropathy (HCC) 01/26/2017   Hypercholesterolemia 01/26/2017   Hypertension, essential 01/26/2017    Type 2 diabetes mellitus with hyperglycemia, with long-term current use of insulin  (HCC) 01/26/2017   Vitamin B12 deficiency 01/26/2017   Vitamin D insufficiency 01/26/2017   Fracture of proximal humerus 09/07/2013   Humerus fracture 09/04/2013   Vitreous hemorrhage, right (HCC) 03/02/2013   Posterior vitreous detachment 06/27/2012   Pseudophakia 06/27/2012   Nuclear sclerotic cataract of both eyes 08/06/2011   Proliferative diabetic retinopathy of both eyes without macular edema associated with type 2 diabetes mellitus (HCC) 08/06/2011    PCP: Sheryle Carwin, MD   REFERRING PROVIDER: Adair Lamar Hummer, MD  REFERRING DIAG: S82.031A (ICD-10-CM) - Displaced transverse fracture of right patella, initial encounter for closed fracture  THERAPY DIAG:  S/P right knee surgery  Impaired functional mobility, balance, gait, and endurance  Rationale for Evaluation and Treatment: Rehabilitation  ONSET DATE: 04/04/24 (Surgery)  SUBJECTIVE:   SUBJECTIVE STATEMENT: Pt states she has had no pain. Pt states she has a appointment with ortho on Monday.     Evaluation: May the 16th fell at the [facility] for husbands appointment. Fell in the hallway due to tripping over own feet. Pt also describes falling in 2014 and breaking her arm. Pt states she would like to improve her balance during therapy episode. Pt states she has a follow up appointment at [facility] hospital Monday and will obtain protocol at that time.   PERTINENT HISTORY:  Shoulder surgery due to another fall  PAIN:  Are you having pain? Yes: NPRS scale: 3-4/10 Pain location: front of right knee Pain description: dull, tight feeling Aggravating factors: tenderness Relieving factors: Advil and tylenol , ice, elevation  PRECAUTIONS: Knee, Fall, and Other: PROTOCOL UNDER MEDIA  RED FLAGS: Pt states she has had stomach problems since before the surgery.   WEIGHT BEARING RESTRICTIONS: Yes WBAT per patient   FALLS:  Has patient fallen in  last 6 months? Yes. 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  DIAGNOSTIC FINDINGS: none on chart  PATIENT SURVEYS:  LEFS: TBA  COGNITION: Overall cognitive status: Within functional limits for tasks assessed     SENSATION: [facility] Not tested at incision  EDEMA:  In immobilizer, TBA when cleared immobilizer off  PALPATION: Pt still with surgical bandaging, slight bruising on front, increased bruising on the back  LOWER EXTREMITY ROM:  Active ROM Right eval Left eval Right  05/09/24  Hip flexion     Hip extension     Hip abduction     Hip adduction     Hip internal rotation     Hip external rotation     Knee flexion TBA TBA 65  Knee extension TBA TBA 0  Ankle dorsiflexion     Ankle plantarflexion     Ankle inversion     Ankle eversion      (Blank rows = not tested)  LOWER EXTREMITY MMT:  MMT Right eval Left eval  Hip flexion TBA 3+  Hip extension    Hip abduction    Hip adduction    Hip internal rotation    Hip external rotation    Knee flexion TBA 4-  Knee extension TBA 4-  Ankle dorsiflexion 4 4  Ankle plantarflexion    Ankle inversion    Ankle eversion     (Blank rows = not tested)    FUNCTIONAL TESTS:  5 times sit to stand: 37.63 seconds 2 minute walk test: 215 feet, RW and with knee brace  GAIT: Distance walked: 75 feet in and out from treatment area Assistive device utilized: Walker - 2 wheeled Level of assistance: Modified independence Comments: Pt demonstrates antalgic gait pattern,  could be influenced by 0 degree locking of immobilizing brace on RLE. Decreased stride length and velocity noted.                                                                                                                                TREATMENT DATE:  05/11/2024  Manual Therapy: -PROM of right knee, 2 sets of 10 reps to pt tolerance  Therapeutic Exercise: -Straight leg raise from semi reclined position, 2 sets of 10 reps -Hip abductions, 2 sets of 10 reps -Heel slides, 2 sets of 10 reps,  -Standing 3 way hip 2 sets 8 reps, bilaterally, pt cued for upright trunk and maintaining of neutral spine    05/09/24 Supine:  no brace Manual to reduce adhesions; myofascial retrictions Heelslides 0-60, 0-65 Hip adduction with blue ball 10X5 Hip abduction with RTB 10X5 holds 1/2 roll SAQ 2X10 with 5 holds   05/04/2024  Therapeutic Exercise: - -5TSTS -Hip isometrics abduction/adduction, 2 set of 10, 5 second holds -Heel slides, 2 sets of 20 reps, gait belt for AAROM -Small range SLR, 1 set of 10 reps, pt cued for quad set and only lifting a few inches off table -Supine quad sets, 1 set of 10 reps, 5 seconds       PATIENT EDUCATION:  Education details: Pt was educated on findings of PT evaluation, prognosis, frequency of therapy visits and rationale, attendance policy, and HEP if given.   Person educated: Patient and Spouse Education method: Explanation, Verbal cues, and Handouts Education comprehension: verbalized understanding, tactile cues required, and needs further education  HOME EXERCISE PROGRAM: Access Code: [email or web address] URL: [email or web address] Date: 04/25/2024 Prepared by: [facility] - [facility]  Exercises - Supine Quadricep Sets  - 2 x daily - 7 x weekly - 1 sets - 10 reps - 5 sec hold - gentle 4 way patellar glides  - 2 x daily - 7 x weekly - 1 sets - 10 reps - Standing Heel Raise with Support  - 2 x daily - 7 x weekly - 2 sets - 10 reps - Supine Heel Slide  with Strap  - 2 x daily - 7 x weekly - 1 sets - 10 reps - to 30 degrees only hold - Long Sitting Ankle Plantar Flexion with Resistance  - 2 x daily - 7 x weekly - 2 sets - 10 reps - Long Sitting Ankle Eversion with Resistance  - 2 x daily - 7 x weekly - 2 sets - 10 reps - Long Sitting Ankle Inversion with Resistance  - 2 x daily - 7 x weekly - 2 sets - 10 reps - Ankle Inversion with Resistance  - 2 x daily - 7 x weekly - 2 sets - 10 reps Access Code: [email or web address] URL: [email or web address] Date: 04/12/2024 Prepared by: Lang Ada  Exercises - Standing 3-way Hip with Vannie  - 1 x daily - 7 x weekly - 3 sets - 10 reps - Seated Quad Set  - 1 x daily - 7 x weekly - 3 sets - 10 reps  ASSESSMENT:  CLINICAL IMPRESSION: Patient continues to demonstrate decreased LE strength, decreased gait quality and balance. Pt limited primarily due to protocol at this point, hopeful for progression following visit with orthopedic doctor Monday. Patient also demonstrates ability to walk without AD today with immobilizer today. Patient able to progress dynamic balance and core activation exercises today with straight leg raise and side lying hip abduction, good performance with verbal cueing. Patient would continue to benefit from skilled physical therapy for increased endurance with ambulation, increased LE strength, and improved balance for improved quality of life, improved independence with gait training and continued progress towards therapy goals.       Eval:Patient is a 73 y.o. female who was seen today for physical therapy evaluation and treatment for S82.031A (ICD-10-CM) -  Displaced transverse fracture of right patella, initial encounter for closed fracture.   Patient demonstrates decreased RLE strength, abnormal pain rating, impaired functional mobility, difficulty with ambulation and impaired balance. Patient demonstrates difficulty with ambulation during today's session with heavy dependence  on UEs, decreased stride length and velocity noted. Patient also demonstrates difficulty getting out of the car and requires assist of therapist, pt able to walk into clinic with modified independence, RW. Patient requires education on role of PT, plan of care, HEP and fall risk. Pt was not taken out of knee immobilizing brace due to lack of protocol provided and HEP was prescribed on a pain free basis. Patient would benefit from skilled physical therapy for increased endurance with ambulation, increased RLE strength, improved functional mobility and improved balance for improved gait quality, return to higher level of function with ADLs, return to driving, and progress towards therapy goals.   OBJECTIVE IMPAIRMENTS: Abnormal gait, decreased activity tolerance, decreased balance, decreased endurance, decreased knowledge of use of DME, decreased mobility, difficulty walking, decreased ROM, decreased strength, increased edema, and pain.   ACTIVITY LIMITATIONS: carrying, lifting, bending, sitting, standing, squatting, stairs, transfers, bed mobility, bathing, toileting, locomotion level, and driving  PARTICIPATION LIMITATIONS: meal prep, cleaning, laundry, driving, shopping, community activity, occupation, and church  PERSONAL FACTORS: Age, Fitness, Past/current experiences, Time since onset of injury/illness/exacerbation, and 1 comorbidity: history of falling are also affecting patient's functional outcome.   REHAB POTENTIAL: Good  CLINICAL DECISION MAKING: Stable/uncomplicated  EVALUATION COMPLEXITY: Low   GOALS: Goals reviewed with patient? No  SHORT TERM GOALS: Target date: 05/03/24  Patient will demonstrate evidence of independence with individualized HEP and will report compliance for at least 3 days per week for optimized progression towards remaining therapy goals. Baseline:  Goal status: INITIAL  2.  Patient will report a decrease in pain level during community ambulation by at least  2 points for improved quality of life. Baseline: 3/10 Goal status: INITIAL     LONG TERM GOALS: Target date: 05/24/24  Pt will demonstrate a an increase of at least 9 points on the LEFS for improved performance of community ambulation and ADL. Baseline: see obective Goal status: INITIAL  2.  Pt will improve 2 MWT by 140 in order to demonstrate improved functional ambulatory capacity in community setting.  Baseline: 133 ft with RW Goal status: INITIAL  3.  Pt will demonstrate WFL ROM (flexion and extension) in right knee, for increased mobility and maximal efficiency of gait cycle during ambulation. Baseline: protocol, pt locked at 0 degrees upon presenting to evaluation Goal status: INITIAL  4.  Pt will demonstrate at least 4/5 MMT for right lower extremity for increased strength during ADL and community ambulation. Baseline: see objective Goal status: INITIAL  5.  Pt will improve 5TSTS by 2.3 seconds in order to improve functional strength during functional transfers. Baseline: see objective Goal status: INITIAL    PLAN:  PT FREQUENCY: 2x/week  PT DURATION: 6 weeks  PLANNED INTERVENTIONS: 97110-Therapeutic exercises, 97530- Therapeutic activity, 97112- Neuromuscular re-education, 97535- Self Care, [id number]- Manual therapy, [phone]- Gait training, Patient/Family education, Balance training, Stair training, Joint mobilization, Scar mobilization, DME instructions, Cryotherapy, and Moist heat  PLAN FOR NEXT SESSION: progress protocol under media.    Continue with standing exercise progression.    Lang Ada, PT, DPT [facility] Office: [phone] 3:31 PM, 05/11/24

## 2024-05-16 ENCOUNTER — Ambulatory Visit (HOSPITAL_COMMUNITY)

## 2024-05-16 ENCOUNTER — Encounter (HOSPITAL_COMMUNITY): Payer: Self-pay

## 2024-05-16 DIAGNOSIS — Z7409 Other reduced mobility: Secondary | ICD-10-CM

## 2024-05-16 DIAGNOSIS — Z9889 Other specified postprocedural states: Secondary | ICD-10-CM

## 2024-05-16 NOTE — Therapy (Signed)
 OUTPATIENT PHYSICAL THERAPY LOWER EXTREMITY TREATMENT   Patient Name: Gabriella Holmes MRN: [id number] DOB:17-Jun-1951, 73 y.o., female Today's Date: 05/16/2024  END OF SESSION:  PT End of Session - 05/16/24 0846     Visit Number 8    Number of Visits 12    Date for PT Re-Evaluation 05/24/24    Authorization Type [employer]AM[employer]    Authorization Time Period no auth required    PT Start Time 0846    PT Stop Time 0925    PT Time Calculation (min) 39 min    Activity Tolerance Patient tolerated treatment well;Patient limited by pain    Behavior During Therapy [facility] for tasks assessed/performed               Past Medical History:  Diagnosis Date   GERD (gastroesophageal reflux disease)    High cholesterol    Hypertension    Type II diabetes mellitus (HCC)    Past Surgical History:  Procedure Laterality Date   BACK SURGERY     CARDIAC CATHETERIZATION  2001   CATARACT EXTRACTION W/ INTRAOCULAR LENS  IMPLANT, BILATERAL Bilateral ~ 2011   CHOLECYSTECTOMY  ~ 2012   COLONOSCOPY N/A 01/09/2014   Procedure: COLONOSCOPY;  Surgeon: Lamar CHRISTELLA Hollingshead, MD;  Location: [facility];  Service: Endoscopy;  Laterality: N/A;  7:30 AM-rescheduled to 845 Doris notified pt   CYST EXCISION  1980's   from facial area.    EYE SURGERY     LUMBAR DISC SURGERY  1989   ORIF HUMERUS FRACTURE Right 09/21/2013   Procedure: OPEN REDUCTION INTERNAL FIXATION (ORIF) PROXIMAL HUMERUS FRACTURE;  Surgeon: Eva Elsie Herring, MD;  Location: [facility] OR;  Service: Orthopedics;  Laterality: Right;  Right open reduction internal fixation humerus fracture   ORIF PROXIMAL HUMERUS FRACTURE Right 09/21/2013   PHOTOCOAGULATION WITH LASER Bilateral ~ 2011-2014   for diabetic retinopathy (09/21/2013)   TRIGGER FINGER RELEASE Left 1980's   TUBAL LIGATION  1975   Patient Active Problem List   Diagnosis Date Noted   Diabetic polyneuropathy (HCC) 01/26/2017   Hypercholesterolemia 01/26/2017   Hypertension, essential 01/26/2017    Type 2 diabetes mellitus with hyperglycemia, with long-term current use of insulin  (HCC) 01/26/2017   Vitamin B12 deficiency 01/26/2017   Vitamin D insufficiency 01/26/2017   Fracture of proximal humerus 09/07/2013   Humerus fracture 09/04/2013   Vitreous hemorrhage, right (HCC) 03/02/2013   Posterior vitreous detachment 06/27/2012   Pseudophakia 06/27/2012   Nuclear sclerotic cataract of both eyes 08/06/2011   Proliferative diabetic retinopathy of both eyes without macular edema associated with type 2 diabetes mellitus (HCC) 08/06/2011    PCP: Sheryle Carwin, MD   REFERRING PROVIDER: Adair Lamar Hummer, MD  REFERRING DIAG: S82.031A (ICD-10-CM) - Displaced transverse fracture of right patella, initial encounter for closed fracture  THERAPY DIAG:  S/P right knee surgery  Impaired functional mobility, balance, gait, and endurance  Rationale for Evaluation and Treatment: Rehabilitation  ONSET DATE: 04/04/24 (Surgery)  SUBJECTIVE:   SUBJECTIVE STATEMENT: Pt states she has had no pain. Pt states she has a appointment with ortho on Monday.     Evaluation: May the 16th fell at the [facility] for husbands appointment. Fell in the hallway due to tripping over own feet. Pt also describes falling in 2014 and breaking her arm. Pt states she would like to improve her balance during therapy episode. Pt states she has a follow up appointment at [facility] hospital Monday and will obtain protocol at that time.   PERTINENT  HISTORY: Shoulder surgery due to another fall  PAIN:  Are you having pain? Yes: NPRS scale: 3-4/10 Pain location: front of right knee Pain description: dull, tight feeling Aggravating factors: tenderness Relieving factors: Advil and tylenol , ice, elevation  PRECAUTIONS: Knee, Fall, and Other: PROTOCOL UNDER MEDIA  RED FLAGS: Pt states she has had stomach problems since before the surgery.   WEIGHT BEARING RESTRICTIONS: Yes WBAT per patient   FALLS:  Has patient fallen in  last 6 months? Yes. 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  DIAGNOSTIC FINDINGS: none on chart  PATIENT SURVEYS:  LEFS: TBA  COGNITION: Overall cognitive status: Within functional limits for tasks assessed     SENSATION: [facility] Not tested at incision  EDEMA:  In immobilizer, TBA when cleared immobilizer off  PALPATION: Pt still with surgical bandaging, slight bruising on front, increased bruising on the back  LOWER EXTREMITY ROM:  Active ROM Right eval Left eval Right  05/09/24  Hip flexion     Hip extension     Hip abduction     Hip adduction     Hip internal rotation     Hip external rotation     Knee flexion TBA TBA 65  Knee extension TBA TBA 0  Ankle dorsiflexion     Ankle plantarflexion     Ankle inversion     Ankle eversion      (Blank rows = not tested)  LOWER EXTREMITY MMT:  MMT Right eval Left eval  Hip flexion TBA 3+  Hip extension    Hip abduction    Hip adduction    Hip internal rotation    Hip external rotation    Knee flexion TBA 4-  Knee extension TBA 4-  Ankle dorsiflexion 4 4  Ankle plantarflexion    Ankle inversion    Ankle eversion     (Blank rows = not tested)    FUNCTIONAL TESTS:  5 times sit to stand: 37.63 seconds 2 minute walk test: 215 feet, RW and with knee brace  GAIT: Distance walked: 75 feet in and out from treatment area Assistive device utilized: Walker - 2 wheeled Level of assistance: Modified independence Comments: Pt demonstrates antalgic gait pattern,  could be influenced by 0 degree locking of immobilizing brace on RLE. Decreased stride length and velocity noted.                                                                                                                                TREATMENT DATE:  05/16/2024  Gait training: -220 feet with brace unlocked to 90 degrees with RW, pt cued for increased right knee flexion -220 feet with brace unlocked to 90 degrees with HHA, pt demonstrates decreased stance time on RLE and decreased R stride length  Therapeutic Exercise: -Knee flexion/extension with LLE assist for OP, 2 sets of 7 reps -standing marches 1 set of 10 reps bilaterally -Standing knee flexion AROM, bilaterally, 2 sets of 10 reps -STS, 1 set of 3 reps, pt requires at least one UE support for completion  -Supine bridges, 1 set of 10 reps -DKTC, 3 sets of 10 reps on green theraball, pt cued for use of LLE for OP    05/11/2024  Manual Therapy: -PROM of right knee, 2 sets of 10 reps to pt tolerance  Therapeutic Exercise: -Straight leg raise from semi reclined position, 2 sets of 10 reps -Hip abductions, 2 sets of 10 reps -Heel slides, 2 sets of 10 reps,  -Standing 3 way hip 2 sets 8 reps, bilaterally, pt cued for upright trunk and maintaining of neutral spine    05/09/24 Supine:  no brace Manual to reduce adhesions; myofascial retrictions Heelslides 0-60, 0-65 Hip adduction with blue ball 10X5 Hip abduction with RTB 10X5 holds 1/2 roll SAQ 2X10 with 5 holds      PATIENT EDUCATION:  Education details: Pt was educated on findings of PT evaluation, prognosis, frequency of therapy visits and rationale, attendance policy, and HEP if given.   Person educated: Patient and Spouse Education method: Explanation, Verbal cues, and Handouts Education comprehension: verbalized understanding, tactile cues required, and needs further education  HOME EXERCISE PROGRAM:   Access Code: [email or web address] URL:  [email or web address] Date: 05/16/2024 Prepared by: Lang Ada  Exercises - Standing Knee Flexion AROM with Chair Support  - 1 x daily - 7 x weekly - 3 sets - 10 reps - Seated Knee Flexion Stretch  - 1 x daily - 7 x weekly - 3 sets - 10 reps - Seated Knee Flexion AAROM  - 1 x daily - 7 x weekly - 3 sets - 10 reps - Seated Knee Extension AAROM  - 1 x daily - 7 x weekly - 3 sets - 10 reps - Supine Bridge  - 1 x daily - 7 x weekly - 3 sets - 10 reps - Standing March with Counter Support  - 1 x daily - 7 x weekly - 3 sets - 10 reps - Sit to Stand with Armchair  - 1 x daily - 7 x weekly - 3 sets - 10 reps  ASSESSMENT:  CLINICAL IMPRESSION: Patient continues to demonstrate decreased RLE strength and ROM, decreased gait quality and balance. Patient also demonstrates decreased tolerance with knee flexion based exercise during today's session with verbal and facial signs of increased pain. Patient able to progress dynamic right knee and core activation exercises today with supine bridges and standing marches, good performance with verbal cueing. HEP updated and revised on today's note, brace to be left donned but unlocked following therapy session today. Patient would continue to benefit from skilled physical therapy for increased endurance with ambulation, increased RLE strength, increased right knee ROM, and improved balance for improved quality of life, improved independence with gait training and continued progress towards therapy goals.        Eval:Patient is a 73 y.o. female who was seen today for physical therapy evaluation and treatment for S82.031A (ICD-10-CM) - Displaced transverse fracture of right patella, initial encounter for closed fracture.  Patient demonstrates decreased RLE strength, abnormal pain rating, impaired functional mobility, difficulty with ambulation and impaired balance. Patient demonstrates difficulty with ambulation during today's session with heavy  dependence on UEs, decreased stride length and velocity noted. Patient also demonstrates difficulty getting out of the car and requires assist of therapist, pt able to walk into clinic with modified independence, RW. Patient requires education on role of PT, plan of care, HEP and fall risk. Pt was not taken out of knee immobilizing brace due to lack of protocol provided and HEP was prescribed on a pain free basis. Patient would benefit from skilled physical therapy for increased endurance with ambulation, increased RLE strength, improved functional mobility and improved balance for improved gait quality, return to higher level of function with ADLs, return to driving, and progress towards therapy goals.   OBJECTIVE IMPAIRMENTS: Abnormal gait, decreased activity tolerance, decreased balance, decreased endurance, decreased knowledge of use of DME, decreased mobility, difficulty walking, decreased ROM, decreased strength, increased edema, and pain.   ACTIVITY LIMITATIONS: carrying, lifting, bending, sitting, standing, squatting, stairs, transfers, bed mobility, bathing, toileting, locomotion level, and driving  PARTICIPATION LIMITATIONS: meal prep, cleaning, laundry, driving, shopping, community activity, occupation, and church  PERSONAL FACTORS: Age, Fitness, Past/current experiences, Time since onset of injury/illness/exacerbation, and 1 comorbidity: history of falling are also affecting patient's functional outcome.   REHAB POTENTIAL: Good  CLINICAL DECISION MAKING: Stable/uncomplicated  EVALUATION COMPLEXITY: Low   GOALS: Goals reviewed with patient? No  SHORT TERM GOALS: Target date: 05/03/24  Patient will demonstrate evidence of independence with individualized HEP and will report compliance for at least 3 days per week for optimized progression towards remaining therapy goals. Baseline:  Goal status: INITIAL  2.  Patient will report a decrease in pain level during community ambulation  by at least 2 points for improved quality of life. Baseline: 3/10 Goal status: INITIAL     LONG TERM GOALS: Target date: 05/24/24  Pt will demonstrate a an increase of at least 9 points on the LEFS for improved performance of community ambulation and ADL. Baseline: see obective Goal status: INITIAL  2.  Pt will improve 2 MWT by 140 in order to demonstrate improved functional ambulatory capacity in community setting.  Baseline: 133 ft with RW Goal status: INITIAL  3.  Pt will demonstrate WFL ROM (flexion and extension) in right knee, for increased mobility and maximal efficiency of gait cycle during ambulation. Baseline: protocol, pt locked at 0 degrees upon presenting to evaluation Goal status: INITIAL  4.  Pt will demonstrate at least 4/5 MMT for right lower extremity for increased strength during ADL and community ambulation. Baseline: see objective Goal status: INITIAL  5.  Pt will improve 5TSTS by 2.3 seconds in order to improve functional strength during functional transfers. Baseline: see objective Goal status: INITIAL    PLAN:  PT FREQUENCY: 2x/week  PT DURATION: 6 weeks  PLANNED INTERVENTIONS: 97110-Therapeutic exercises, 97530- Therapeutic activity, 97112- Neuromuscular re-education, 97535- Self Care, [id number]- Manual therapy, [phone]- Gait training, Patient/Family education, Balance training, Stair training, Joint mobilization, Scar mobilization, DME instructions, Cryotherapy, and Moist heat  PLAN FOR NEXT SESSION: progress LE strengthening and R knee ROM flexion specifically   Lang Ada, PT, DPT [facility] Office: [phone] 9:55 AM, 05/16/24

## 2024-05-18 ENCOUNTER — Ambulatory Visit (HOSPITAL_COMMUNITY)

## 2024-05-18 DIAGNOSIS — Z9889 Other specified postprocedural states: Secondary | ICD-10-CM

## 2024-05-18 DIAGNOSIS — Z7409 Other reduced mobility: Secondary | ICD-10-CM

## 2024-05-18 NOTE — Therapy (Signed)
 OUTPATIENT PHYSICAL THERAPY LOWER EXTREMITY TREATMENT   Patient Name: Gabriella Holmes MRN: [id number] DOB:1951-08-04, 73 y.o., female Today's Date: 05/18/2024  END OF SESSION:  PT End of Session - 05/18/24 1010     Visit Number 9    Number of Visits 12    Date for PT Re-Evaluation 05/24/24    Authorization Type CHAM[employer]    Authorization Time Period no auth required    PT Start Time 1010    PT Stop Time 1050    PT Time Calculation (min) 40 min    Activity Tolerance Patient tolerated treatment well;Patient limited by pain    Behavior During Therapy [facility] for tasks assessed/performed               Past Medical History:  Diagnosis Date   GERD (gastroesophageal reflux disease)    High cholesterol    Hypertension    Type II diabetes mellitus (HCC)    Past Surgical History:  Procedure Laterality Date   BACK SURGERY     CARDIAC CATHETERIZATION  2001   CATARACT EXTRACTION W/ INTRAOCULAR LENS  IMPLANT, BILATERAL Bilateral ~ 2011   CHOLECYSTECTOMY  ~ 2012   COLONOSCOPY N/A 01/09/2014   Procedure: COLONOSCOPY;  Surgeon: Lamar CHRISTELLA Hollingshead, MD;  Location: [facility];  Service: Endoscopy;  Laterality: N/A;  7:30 AM-rescheduled to [address] notified pt   CYST EXCISION  1980's   from facial area.    EYE SURGERY     LUMBAR DISC SURGERY  1989   ORIF HUMERUS FRACTURE Right 09/21/2013   Procedure: OPEN REDUCTION INTERNAL FIXATION (ORIF) PROXIMAL HUMERUS FRACTURE;  Surgeon: Eva Elsie Herring, MD;  Location: [facility] OR;  Service: Orthopedics;  Laterality: Right;  Right open reduction internal fixation humerus fracture   ORIF PROXIMAL HUMERUS FRACTURE Right 09/21/2013   PHOTOCOAGULATION WITH LASER Bilateral ~ 2011-2014   for diabetic retinopathy (09/21/2013)   TRIGGER FINGER RELEASE Left 1980's   TUBAL LIGATION  1975   Patient Active Problem List   Diagnosis Date Noted   Diabetic polyneuropathy (HCC) 01/26/2017   Hypercholesterolemia 01/26/2017   Hypertension, essential 01/26/2017    Type 2 diabetes mellitus with hyperglycemia, with long-term current use of insulin  (HCC) 01/26/2017   Vitamin B12 deficiency 01/26/2017   Vitamin D insufficiency 01/26/2017   Fracture of proximal humerus 09/07/2013   Humerus fracture 09/04/2013   Vitreous hemorrhage, right (HCC) 03/02/2013   Posterior vitreous detachment 06/27/2012   Pseudophakia 06/27/2012   Nuclear sclerotic cataract of both eyes 08/06/2011   Proliferative diabetic retinopathy of both eyes without macular edema associated with type 2 diabetes mellitus (HCC) 08/06/2011    PCP: Sheryle Carwin, MD   REFERRING PROVIDER: Adair Lamar Hummer, MD  REFERRING DIAG: S82.031A (ICD-10-CM) - Displaced transverse fracture of right patella, initial encounter for closed fracture  THERAPY DIAG:  S/P right knee surgery  Impaired functional mobility, balance, gait, and endurance  Rationale for Evaluation and Treatment: Rehabilitation  ONSET DATE: 04/04/24 (Surgery)  SUBJECTIVE:   SUBJECTIVE STATEMENT: Pt states she has had no pain. Pt states she has a appointment with ortho on Monday.     Evaluation: May the 16th fell at the [facility] for husbands appointment. Fell in the hallway due to tripping over own feet. Pt also describes falling in 2014 and breaking her arm. Pt states she would like to improve her balance during therapy episode. Pt states she has a follow up appointment at [facility] hospital Monday and will obtain protocol at that time.   PERTINENT  HISTORY: Shoulder surgery due to another fall  PAIN:  Are you having pain? Yes: NPRS scale: 3-4/10 Pain location: front of right knee Pain description: dull, tight feeling Aggravating factors: tenderness Relieving factors: Advil and tylenol , ice, elevation  PRECAUTIONS: Knee, Fall, and Other: PROTOCOL UNDER MEDIA  RED FLAGS: Pt states she has had stomach problems since before the surgery.   WEIGHT BEARING RESTRICTIONS: Yes WBAT per patient   FALLS:  Has patient fallen in  last 6 months? Yes. 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  DIAGNOSTIC FINDINGS: none on chart  PATIENT SURVEYS:  LEFS: TBA  COGNITION: Overall cognitive status: Within functional limits for tasks assessed     SENSATION: [facility] Not tested at incision  EDEMA:  In immobilizer, TBA when cleared immobilizer off  PALPATION: Pt still with surgical bandaging, slight bruising on front, increased bruising on the back  LOWER EXTREMITY ROM:  Active ROM Right eval Left eval Right  05/09/24 Right 05/18/24  Hip flexion      Hip extension      Hip abduction      Hip adduction      Hip internal rotation      Hip external rotation      Knee flexion TBA TBA 65 80, 85 AAROM  Knee extension TBA TBA 0   Ankle dorsiflexion      Ankle plantarflexion      Ankle inversion      Ankle eversion       (Blank rows = not tested)  LOWER EXTREMITY MMT:  MMT Right eval Left eval  Hip flexion TBA 3+  Hip extension    Hip abduction    Hip adduction    Hip internal rotation    Hip external rotation    Knee flexion TBA 4-  Knee extension TBA 4-  Ankle dorsiflexion 4 4  Ankle plantarflexion    Ankle inversion    Ankle eversion     (Blank rows = not tested)    FUNCTIONAL TESTS:  5 times sit to stand: 37.63 seconds 2 minute walk test: 215 feet, RW and with knee brace  GAIT: Distance walked: 75 feet in and out from treatment area Assistive device utilized: Environmental consultant - 2 wheeled Level of assistance: Modified independence Comments: Pt  demonstrates antalgic gait pattern, could be influenced by 0 degree locking of immobilizing brace on RLE. Decreased stride length and velocity noted.  TREATMENT DATE:  05/18/24 Supine: Ankle pumps x 10 Quad sets 5 x 10 SLR 2 x 10 Right Heel slides x 5 AROM; 80 degrees Right Heels slides with strap 5 hold x 10  Seated  Heel slides x 2' Heel slides with overpressure x 5 Ambulation with SPC in right hand (feels awkward to her in the left) x 1 lap   05/16/2024  Gait training: -220 feet with brace unlocked to 90 degrees with RW, pt cued for increased right knee flexion -220 feet with brace unlocked to 90 degrees with HHA, pt demonstrates decreased stance time on RLE and decreased R stride length  Therapeutic Exercise: -Knee flexion/extension with LLE assist for OP, 2 sets of 7 reps -standing marches 1 set of 10 reps bilaterally -Standing knee flexion AROM, bilaterally, 2 sets of 10 reps -STS, 1 set of 3 reps, pt requires at least one UE support for completion  -Supine bridges, 1 set of 10 reps -DKTC, 3 sets of 10 reps on green theraball, pt cued for use of LLE for OP    05/11/2024  Manual Therapy: -PROM of right knee, 2 sets of 10 reps to pt tolerance  Therapeutic Exercise: -Straight leg raise from semi reclined position, 2 sets of 10 reps -Hip abductions, 2 sets of 10 reps -Heel slides, 2 sets of 10 reps,  -Standing 3 way hip 2 sets 8 reps, bilaterally, pt cued for upright trunk and maintaining of neutral spine    05/09/24 Supine:  no brace Manual to reduce adhesions; myofascial retrictions Heelslides 0-60, 0-65 Hip adduction with blue ball 10X5 Hip abduction with RTB 10X5 holds 1/2 roll SAQ 2X10 with 5 holds      PATIENT EDUCATION:  Education details: Pt was educated on findings of PT evaluation, prognosis, frequency of therapy visits and  rationale, attendance policy, and HEP if given.   Person educated: Patient and Spouse Education method: Explanation, Verbal cues, and Handouts Education comprehension: verbalized understanding, tactile cues required, and needs further education  HOME EXERCISE PROGRAM:   Access Code: [email or web address] URL: [email or web address] Date: 05/16/2024 Prepared by: Lang Ada  Exercises - Standing Knee Flexion AROM with Chair Support  - 1 x daily - 7 x weekly - 3 sets - 10 reps - Seated Knee Flexion Stretch  - 1 x daily - 7 x weekly - 3 sets - 10 reps - Seated Knee Flexion AAROM  - 1 x daily - 7 x weekly - 3 sets - 10 reps - Seated Knee Extension AAROM  - 1 x daily - 7 x weekly - 3 sets - 10 reps - Supine Bridge  - 1 x daily - 7 x weekly - 3 sets - 10 reps - Standing March with Counter Support  - 1 x daily - 7 x weekly - 3 sets - 10 reps - Sit to Stand with Armchair  - 1 x daily - 7 x weekly - 3 sets - 10 reps  ASSESSMENT:  CLINICAL IMPRESSION: Today's session continues with focus on right knee mobility and strengthening per protocol.  Patient is s/p 8 weeks.  She is able to perform 2 sets of 10 SLR today without lag. Right knee flexion in supine 80 degrees today; 85 AAROM.  Encouraged patient to work with this at home.  Also instructed her to go ahead and purchase a cane and begin practicing with it at home.   Patient would continue to benefit from skilled physical therapy for increased endurance with ambulation, increased RLE strength, increased  right knee ROM, and improved balance for improved quality of life, improved independence with gait training and continued progress towards therapy goals.        Eval:Patient is a 72 y.o. female who was seen today for physical therapy evaluation and treatment for S82.031A (ICD-10-CM) - Displaced transverse fracture of right patella, initial encounter for closed fracture.   Patient demonstrates decreased RLE strength, abnormal pain rating,  impaired functional mobility, difficulty with ambulation and impaired balance. Patient demonstrates difficulty with ambulation during today's session with heavy dependence on UEs, decreased stride length and velocity noted. Patient also demonstrates difficulty getting out of the car and requires assist of therapist, pt able to walk into clinic with modified independence, RW. Patient requires education on role of PT, plan of care, HEP and fall risk. Pt was not taken out of knee immobilizing brace due to lack of protocol provided and HEP was prescribed on a pain free basis. Patient would benefit from skilled physical therapy for increased endurance with ambulation, increased RLE strength, improved functional mobility and improved balance for improved gait quality, return to higher level of function with ADLs, return to driving, and progress towards therapy goals.   OBJECTIVE IMPAIRMENTS: Abnormal gait, decreased activity tolerance, decreased balance, decreased endurance, decreased knowledge of use of DME, decreased mobility, difficulty walking, decreased ROM, decreased strength, increased edema, and pain.   ACTIVITY LIMITATIONS: carrying, lifting, bending, sitting, standing, squatting, stairs, transfers, bed mobility, bathing, toileting, locomotion level, and driving  PARTICIPATION LIMITATIONS: meal prep, cleaning, laundry, driving, shopping, community activity, occupation, and church  PERSONAL FACTORS: Age, Fitness, Past/current experiences, Time since onset of injury/illness/exacerbation, and 1 comorbidity: history of falling are also affecting patient's functional outcome.   REHAB POTENTIAL: Good  CLINICAL DECISION MAKING: Stable/uncomplicated  EVALUATION COMPLEXITY: Low   GOALS: Goals reviewed with patient? No  SHORT TERM GOALS: Target date: 05/03/24  Patient will demonstrate evidence of independence with individualized HEP and will report compliance for at least 3 days per week for  optimized progression towards remaining therapy goals. Baseline:  Goal status: INITIAL  2.  Patient will report a decrease in pain level during community ambulation by at least 2 points for improved quality of life. Baseline: 3/10 Goal status: INITIAL     LONG TERM GOALS: Target date: 05/24/24  Pt will demonstrate a an increase of at least 9 points on the LEFS for improved performance of community ambulation and ADL. Baseline: see obective Goal status: INITIAL  2.  Pt will improve 2 MWT by 140 in order to demonstrate improved functional ambulatory capacity in community setting.  Baseline: 133 ft with RW Goal status: INITIAL  3.  Pt will demonstrate WFL ROM (flexion and extension) in right knee, for increased mobility and maximal efficiency of gait cycle during ambulation. Baseline: protocol, pt locked at 0 degrees upon presenting to evaluation Goal status: INITIAL  4.  Pt will demonstrate at least 4/5 MMT for right lower extremity for increased strength during ADL and community ambulation. Baseline: see objective Goal status: INITIAL  5.  Pt will improve 5TSTS by 2.3 seconds in order to improve functional strength during functional transfers. Baseline: see objective Goal status: INITIAL    PLAN:  PT FREQUENCY: 2x/week  PT DURATION: 6 weeks  PLANNED INTERVENTIONS: 97110-Therapeutic exercises, 97530- Therapeutic activity, 97112- Neuromuscular re-education, 97535- Self Care, [id number]- Manual therapy, [phone]- Gait training, Patient/Family education, Balance training, Stair training, Joint mobilization, Scar mobilization, DME instructions, Cryotherapy, and Moist heat  PLAN FOR NEXT SESSION: progress LE  strengthening and R knee ROM flexion specifically per protocl  10:54 AM, 05/18/24 Tameka Hoiland Small Demonica Farrey MPT [facility] [place] [phone] Ph:[phone]

## 2024-05-24 ENCOUNTER — Ambulatory Visit (HOSPITAL_COMMUNITY)

## 2024-05-24 ENCOUNTER — Encounter (HOSPITAL_COMMUNITY): Payer: Self-pay

## 2024-05-24 DIAGNOSIS — Z9889 Other specified postprocedural states: Secondary | ICD-10-CM

## 2024-05-24 DIAGNOSIS — Z7409 Other reduced mobility: Secondary | ICD-10-CM

## 2024-05-24 NOTE — Therapy (Signed)
 OUTPATIENT PHYSICAL THERAPY LOWER EXTREMITY TREATMENT/PROGRESS NOTE  Progress Note Reporting Period 04/12/24 to 05/24/24  See note below for Objective Data and Assessment of Progress/Goals.     Patient Name: Gabriella Holmes MRN: [id number] DOB:[birth year], 73 y.o., female Today's Date: 05/24/2024  END OF SESSION:  PT End of Session - 05/24/24 0837     Visit Number 10    Number of Visits 12    Date for PT Re-Evaluation 05/24/24    Authorization Type [employer]    Authorization Time Period no auth required    Progress Note Due on Visit 10    PT Start Time 0840    PT Stop Time 0923    PT Time Calculation (min) 43 min    Activity Tolerance Patient tolerated treatment well;Patient limited by pain    Behavior During Therapy [facility] for tasks assessed/performed                Past Medical History:  Diagnosis Date   GERD (gastroesophageal reflux disease)    High cholesterol    Hypertension    Type II diabetes mellitus (HCC)    Past Surgical History:  Procedure Laterality Date   BACK SURGERY     CARDIAC CATHETERIZATION  2001   CATARACT EXTRACTION W/ INTRAOCULAR LENS  IMPLANT, BILATERAL Bilateral ~ 2011   CHOLECYSTECTOMY  ~ 2012   COLONOSCOPY N/A 01/09/2014   Procedure: COLONOSCOPY;  Surgeon: Lamar CHRISTELLA Hollingshead, MD;  Location: [facility];  Service: Endoscopy;  Laterality: N/A;  7:30 AM-rescheduled to [address] notified pt   CYST EXCISION  1980's   from facial area.    EYE SURGERY     LUMBAR DISC SURGERY  1989   ORIF HUMERUS FRACTURE Right 09/21/2013   Procedure: OPEN REDUCTION INTERNAL FIXATION (ORIF) PROXIMAL HUMERUS FRACTURE;  Surgeon: Eva Elsie Herring, MD;  Location: [facility] OR;  Service: Orthopedics;  Laterality: Right;  Right open reduction internal fixation humerus fracture   ORIF PROXIMAL HUMERUS FRACTURE Right 09/21/2013   PHOTOCOAGULATION WITH LASER Bilateral ~ 2011-2014   for diabetic retinopathy (09/21/2013)   TRIGGER FINGER RELEASE Left 1980's   TUBAL  LIGATION  1975   Patient Active Problem List   Diagnosis Date Noted   Diabetic polyneuropathy (HCC) 01/26/2017   Hypercholesterolemia 01/26/2017   Hypertension, essential 01/26/2017   Type 2 diabetes mellitus with hyperglycemia, with long-term current use of insulin  (HCC) 01/26/2017   Vitamin B12 deficiency 01/26/2017   Vitamin D insufficiency 01/26/2017   Fracture of proximal humerus 09/07/2013   Humerus fracture 09/04/2013   Vitreous hemorrhage, right (HCC) 03/02/2013   Posterior vitreous detachment 06/27/2012   Pseudophakia 06/27/2012   Nuclear sclerotic cataract of both eyes 08/06/2011   Proliferative diabetic retinopathy of both eyes without macular edema associated with type 2 diabetes mellitus (HCC) 08/06/2011    PCP: Sheryle Carwin, MD   REFERRING PROVIDER: Adair Lamar Hummer, MD  REFERRING DIAG: S82.031A (ICD-10-CM) - Displaced transverse fracture of right patella, initial encounter for closed fracture  THERAPY DIAG:  S/P right knee surgery  Impaired functional mobility, balance, gait, and endurance  Rationale for Evaluation and Treatment: Rehabilitation  ONSET DATE: 04/04/24 (Surgery)  SUBJECTIVE:   SUBJECTIVE STATEMENT: Pt states she has had no pain. Pt states she has a appointment with ortho on Monday.     Evaluation: May the 16th fell at the [facility] for husbands appointment. Fell in the hallway due to tripping over own feet. Pt also describes falling in 2014 and breaking her arm. Pt states  she would like to improve her balance during therapy episode. Pt states she has a follow up appointment at [facility] hospital Monday and will obtain protocol at that time.   PERTINENT HISTORY: Shoulder surgery due to another fall  PAIN:  Are you having pain? Yes: NPRS scale: 3-4/10 Pain location: front of right knee Pain description: dull, tight feeling Aggravating factors: tenderness Relieving factors: Advil and tylenol , ice, elevation  PRECAUTIONS: Knee, Fall, and Other:  PROTOCOL UNDER MEDIA  RED FLAGS: Pt states she has had stomach problems since before the surgery.   WEIGHT BEARING RESTRICTIONS: Yes WBAT per patient   FALLS:  Has patient fallen in last 6 months? Yes. 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  DIAGNOSTIC FINDINGS: none on chart  PATIENT SURVEYS:  LEFS: 36/80  COGNITION: Overall cognitive status: Within functional limits for tasks assessed     SENSATION: [facility] Not tested at incision  EDEMA:  In immobilizer, TBA when cleared immobilizer off  PALPATION: Pt still with surgical bandaging, slight bruising on front, increased bruising on the back  LOWER EXTREMITY ROM:  Active ROM Right eval Left eval Right  05/09/24 Right 05/18/24  Hip flexion      Hip extension      Hip abduction      Hip adduction      Hip internal rotation      Hip external rotation      Knee flexion TBA TBA 65 80, 85 AAROM  Knee extension TBA TBA 0   Ankle dorsiflexion      Ankle plantarflexion      Ankle inversion      Ankle eversion       (Blank rows = not tested)  LOWER EXTREMITY MMT:  MMT Right eval Left eval  Hip flexion TBA 3+  Hip extension    Hip abduction    Hip adduction    Hip internal rotation    Hip external rotation    Knee flexion TBA 4-  Knee extension TBA 4-  Ankle dorsiflexion 4 4  Ankle plantarflexion    Ankle inversion    Ankle eversion     (Blank rows = not tested)    FUNCTIONAL TESTS:  5 times sit to stand: 37.63 seconds 2 minute walk test: 215 feet, RW  and with knee brace  05/24/24:  :  335 feet, RW and knee brace 5TSTS: 21.81 seconds   GAIT: Distance walked: 75 feet in and out from treatment area Assistive device utilized: Environmental consultant - 2 wheeled Level of assistance: Modified independence Comments: Pt demonstrates antalgic gait pattern, could be influenced by 0 degree locking of immobilizing brace on RLE. Decreased stride length and velocity noted.  TREATMENT DATE:  05/24/2024  Progress note:  , 5TSTS, LEFS, Protocol reviewed Gait training: -335 feet with brace unlocked to 90 degrees with RW, pt cued for pain free right knee flexion Therapeutic Exercise: -SLR 2 x 10 -Right Heel slides x 5 AROM; 92 degrees -Right Heels slides with strap 5 hold x 10 -Standing knee flexion AROM, bilaterally, 2 sets of 10 reps -Standing marches, 1 set of 10 reps bilaterally -STS, 1 set of 3 reps, pt requires at least one UE support for completion  -Supine bridges, 1 set of 10 reps, 5 second holds   05/18/24 Supine: Ankle pumps x 10 Quad sets 5 x 10 SLR 2 x 10 Right Heel slides x 5 AROM; 80 degrees Right Heels slides with strap 5 hold x 10  Seated  Heel slides x 2' Heel slides with overpressure x 5 Ambulation with SPC in right hand (feels awkward to her in the left) x 1 lap   05/16/2024  Gait training: -220 feet with brace unlocked to 90 degrees with RW, pt cued for increased right knee flexion -220 feet with brace unlocked to 90 degrees with HHA, pt demonstrates decreased stance time on RLE and decreased R stride length  Therapeutic Exercise: -Knee flexion/extension with LLE assist for OP, 2 sets of 7 reps -standing marches 1 set of 10 reps bilaterally -Standing knee flexion AROM, bilaterally, 2 sets of 10 reps -STS, 1 set of 3 reps, pt requires at least one UE support for completion  -Supine bridges, 1  set of 10 reps -DKTC, 3 sets of 10 reps on green theraball, pt cued for use of LLE for OP    PATIENT EDUCATION:  Education details: Pt was educated on findings of PT evaluation, prognosis, frequency of therapy visits and rationale, attendance policy, and HEP if given.   Person educated: Patient and Spouse Education method: Explanation, Verbal cues, and Handouts Education comprehension: verbalized understanding, tactile cues required, and needs further education  HOME EXERCISE PROGRAM:   Access Code: [email or web address] URL: [email or web address] Date: 05/16/2024 Prepared by: Lang Ada  Exercises - Standing Knee Flexion AROM with Chair Support  - 1 x daily - 7 x weekly - 3 sets - 10 reps - Seated Knee Flexion Stretch  - 1 x daily - 7 x weekly - 3 sets - 10 reps - Seated Knee Flexion AAROM  - 1 x daily - 7 x weekly - 3 sets - 10 reps - Seated Knee Extension AAROM  - 1 x daily - 7 x weekly - 3 sets - 10 reps - Supine Bridge  - 1 x daily - 7 x weekly - 3 sets - 10 reps - Standing March with Counter Support  - 1 x daily - 7 x weekly - 3 sets - 10 reps - Sit to Stand with Armchair  - 1 x daily - 7 x weekly - 3 sets - 10 reps  ASSESSMENT:  CLINICAL IMPRESSION: Patient continues to demonstrate decreased RLE strength/ROM, improved gait quality and slightly impaired balance. Patient also demonstrates increased endurance with aerobic based exercise during today's session. Patient able to progress dynamic balance and core activation exercises today with increased distance on and progressed ROM with heel slides, good performance with verbal cueing. Pt has met all but two long term goals with good progress demonstrated since the start of therapy. Patient would benefit from additional four weeks of skilled physical therapy for increased endurance with ambulation, increased RLE strength/ROM, and improved balance for improved quality of  life, improved independence with gait training and  continued progress towards therapy goals.         Eval:Patient is a 73 y.o. female who was seen today for physical therapy evaluation and treatment for S82.031A (ICD-10-CM) - Displaced transverse fracture of right patella, initial encounter for closed fracture.   Patient demonstrates decreased RLE strength, abnormal pain rating, impaired functional mobility, difficulty with ambulation and impaired balance. Patient demonstrates difficulty with ambulation during today's session with heavy dependence on UEs, decreased stride length and velocity noted. Patient also demonstrates difficulty getting out of the car and requires assist of therapist, pt able to walk into clinic with modified independence, RW. Patient requires education on role of PT, plan of care, HEP and fall risk. Pt was not taken out of knee immobilizing brace due to lack of protocol provided and HEP was prescribed on a pain free basis. Patient would benefit from skilled physical therapy for increased endurance with ambulation, increased RLE strength, improved functional mobility and improved balance for improved gait quality, return to higher level of function with ADLs, return to driving, and progress towards therapy goals.   OBJECTIVE IMPAIRMENTS: Abnormal gait, decreased activity tolerance, decreased balance, decreased endurance, decreased knowledge of use of DME, decreased mobility, difficulty walking, decreased ROM, decreased strength, increased edema, and pain.   ACTIVITY LIMITATIONS: carrying, lifting, bending, sitting, standing, squatting, stairs, transfers, bed mobility, bathing, toileting, locomotion level, and driving  PARTICIPATION LIMITATIONS: meal prep, cleaning, laundry, driving, shopping, community activity, occupation, and church  PERSONAL FACTORS: Age, Fitness, Past/current experiences, Time since onset of injury/illness/exacerbation, and 1 comorbidity: history of falling are also affecting patient's functional outcome.    REHAB POTENTIAL: Good  CLINICAL DECISION MAKING: Stable/uncomplicated  EVALUATION COMPLEXITY: Low   GOALS: Goals reviewed with patient? No  SHORT TERM GOALS: Target date: 05/03/24  Patient will demonstrate evidence of independence with individualized HEP and will report compliance for at least 3 days per week for optimized progression towards remaining therapy goals. Baseline:  Goal status: MET  2.  Patient will report a decrease in pain level during community ambulation by at least 2 points for improved quality of life. Baseline: 3/10 Goal status: MET     LONG TERM GOALS: Target date: 05/24/24  Pt will demonstrate a an increase of at least 9 points on the LEFS for improved performance of community ambulation and ADL. Baseline: see obective Goal status: IN PROGRESS  2.  Pt will improve 2 MWT by 140 in order to demonstrate improved functional ambulatory capacity in community setting.  Baseline: 133 ft with RW Goal status: MET  3.  Pt will demonstrate WFL ROM (flexion and extension) in right knee, for increased mobility and maximal efficiency of gait cycle during ambulation. Baseline: protocol, pt locked at 0 degrees upon presenting to evaluation Goal status: IN PROGRESS  4.  Pt will demonstrate at least 4/5 MMT for right lower extremity for increased strength during ADL and community ambulation. Baseline: see objective Goal status: IN PROGRESS  5.  Pt will improve 5TSTS by 2.3 seconds in order to improve functional strength during functional transfers. Baseline: see objective Goal status: MET    PLAN:  PT FREQUENCY: 2x/week  PT DURATION: 6 weeks  PLANNED INTERVENTIONS: 97110-Therapeutic exercises, 97530- Therapeutic activity, 97112- Neuromuscular re-education, 97535- Self Care, [id number]- Manual therapy, [phone]- Gait training, Patient/Family education, Balance training, Stair training, Joint mobilization, Scar mobilization, DME instructions, Cryotherapy, and Moist  heat  PLAN FOR NEXT SESSION: progress LE strengthening and R knee ROM  flexion specifically per protocl  Lang Ada, PT, DPT [facility] Office: [phone] 9:31 AM, 05/24/24

## 2024-05-26 ENCOUNTER — Ambulatory Visit (HOSPITAL_COMMUNITY)

## 2024-05-26 DIAGNOSIS — Z9889 Other specified postprocedural states: Secondary | ICD-10-CM

## 2024-05-26 DIAGNOSIS — Z7409 Other reduced mobility: Secondary | ICD-10-CM

## 2024-05-26 NOTE — Therapy (Signed)
 OUTPATIENT PHYSICAL THERAPY LOWER EXTREMITY TREATMENT    Patient Name: Gabriella Holmes MRN: [id number] DOB:[birth year], 73 y.o., female Today's Date: 05/26/2024  END OF SESSION:  PT End of Session - 05/26/24 1259     Visit Number 11    Number of Visits 20    Date for PT Re-Evaluation 06/21/24    Authorization Type [employer]    Authorization Time Period no auth required    Progress Note Due on Visit 20    PT Start Time 1300    PT Stop Time 1340    PT Time Calculation (min) 40 min    Activity Tolerance Patient tolerated treatment well    Behavior During Therapy WFL for tasks assessed/performed                Past Medical History:  Diagnosis Date   GERD (gastroesophageal reflux disease)    High cholesterol    Hypertension    Type II diabetes mellitus (HCC)    Past Surgical History:  Procedure Laterality Date   BACK SURGERY     CARDIAC CATHETERIZATION  2001   CATARACT EXTRACTION W/ INTRAOCULAR LENS  IMPLANT, BILATERAL Bilateral ~ 2011   CHOLECYSTECTOMY  ~ 2012   COLONOSCOPY N/A 01/09/2014   Procedure: COLONOSCOPY;  Surgeon: Lamar CHRISTELLA Hollingshead, MD;  Location: [facility];  Service: Endoscopy;  Laterality: N/A;  7:30 AM-rescheduled to [address] notified pt   CYST EXCISION  1980's   from facial area.    EYE SURGERY     LUMBAR DISC SURGERY  1989   ORIF HUMERUS FRACTURE Right 09/21/2013   Procedure: OPEN REDUCTION INTERNAL FIXATION (ORIF) PROXIMAL HUMERUS FRACTURE;  Surgeon: Eva Elsie Herring, MD;  Location: [facility] OR;  Service: Orthopedics;  Laterality: Right;  Right open reduction internal fixation humerus fracture   ORIF PROXIMAL HUMERUS FRACTURE Right 09/21/2013   PHOTOCOAGULATION WITH LASER Bilateral ~ 2011-2014   for diabetic retinopathy (09/21/2013)   TRIGGER FINGER RELEASE Left 1980's   TUBAL LIGATION  1975   Patient Active Problem List   Diagnosis Date Noted   Diabetic polyneuropathy (HCC) 01/26/2017   Hypercholesterolemia 01/26/2017   Hypertension,  essential 01/26/2017   Type 2 diabetes mellitus with hyperglycemia, with long-term current use of insulin  (HCC) 01/26/2017   Vitamin B12 deficiency 01/26/2017   Vitamin D insufficiency 01/26/2017   Fracture of proximal humerus 09/07/2013   Humerus fracture 09/04/2013   Vitreous hemorrhage, right (HCC) 03/02/2013   Posterior vitreous detachment 06/27/2012   Pseudophakia 06/27/2012   Nuclear sclerotic cataract of both eyes 08/06/2011   Proliferative diabetic retinopathy of both eyes without macular edema associated with type 2 diabetes mellitus (HCC) 08/06/2011    PCP: Sheryle Carwin, MD   REFERRING PROVIDER: Adair Lamar Hummer, MD  REFERRING DIAG: S82.031A (ICD-10-CM) - Displaced transverse fracture of right patella, initial encounter for closed fracture  THERAPY DIAG:  S/P right knee surgery  Impaired functional mobility, balance, gait, and endurance  Rationale for Evaluation and Treatment: Rehabilitation  ONSET DATE: 04/04/24 (Surgery)  SUBJECTIVE:   SUBJECTIVE STATEMENT: Arrives with SPC.  Patient reports no pain.  Patient will be s/p 8 weeks on 05/30/24.  Patient returns to MD August 25th; mild swelling noted right ankle  Evaluation: May the 16th fell at the [facility] for husbands appointment. Fell in the hallway due to tripping over own feet. Pt also describes falling in 2014 and breaking her arm. Pt states she would like to improve her balance during therapy episode. Pt states she has a  follow up appointment at [facility] hospital Monday and will obtain protocol at that time.   PERTINENT HISTORY: Shoulder surgery due to another fall  PAIN:  Are you having pain? Yes: NPRS scale: 3-4/10 Pain location: front of right knee Pain description: dull, tight feeling Aggravating factors: tenderness Relieving factors: Advil and tylenol , ice, elevation  PRECAUTIONS: Knee, Fall, and Other: PROTOCOL UNDER MEDIA  RED FLAGS: Pt states she has had stomach problems since before the  surgery.   WEIGHT BEARING RESTRICTIONS: Yes WBAT per patient   FALLS:  Has patient fallen in last 6 months? Yes. 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  DIAGNOSTIC FINDINGS: none on chart  PATIENT SURVEYS:  LEFS: 36/80  COGNITION: Overall cognitive status: Within functional limits for tasks assessed     SENSATION: [facility] Not tested at incision  EDEMA:  In immobilizer, TBA when cleared immobilizer off  PALPATION: Pt still with surgical bandaging, slight bruising on front, increased bruising on the back  LOWER EXTREMITY ROM:  Active ROM Right eval Left eval Right  05/09/24 Right 05/18/24  Hip flexion      Hip extension      Hip abduction      Hip adduction      Hip internal rotation      Hip external rotation      Knee flexion TBA TBA 65 80, 85 AAROM  Knee extension TBA TBA 0   Ankle dorsiflexion      Ankle plantarflexion      Ankle inversion      Ankle eversion       (Blank rows = not tested)  LOWER EXTREMITY MMT:  MMT Right eval Left eval  Hip flexion TBA 3+  Hip extension    Hip abduction    Hip adduction    Hip internal rotation    Hip external rotation    Knee flexion TBA 4-  Knee extension TBA 4-  Ankle dorsiflexion 4 4  Ankle plantarflexion    Ankle inversion    Ankle eversion     (Blank rows = not tested)    FUNCTIONAL TESTS:  5 times sit to stand: 37.63 seconds 2 minute walk test: 215 feet, RW and with knee brace  05/24/24:  :  335 feet, RW and knee brace 5TSTS: 21.81  seconds   GAIT: Distance walked: 75 feet in and out from treatment area Assistive device utilized: Environmental consultant - 2 wheeled Level of assistance: Modified independence Comments: Pt demonstrates antalgic gait pattern, could be influenced by 0 degree locking of immobilizing brace on RLE. Decreased stride length and velocity noted.  TREATMENT DATE:  05/26/24 1 lap around PT gym with cane Step navigation up and down 7 steps with 1 hand rail and SPC Heel raises 2 x 10 6 step ups 2 x 10 AROM right knee in supine 95 degrees Bike seat 12 half revolutions x 5' 6 step right knee drives for flexion x 2' Slant board 5 x 20   05/24/2024  Progress note:  , 5TSTS, LEFS, Protocol reviewed Gait training: -335 feet with brace unlocked to 90 degrees with RW, pt cued for pain free right knee flexion Therapeutic Exercise: -SLR 2 x 10 -Right Heel slides x 5 AROM; 92 degrees -Right Heels slides with strap 5 hold x 10 -Standing knee flexion AROM, bilaterally, 2 sets of 10 reps -Standing marches, 1 set of 10 reps bilaterally -STS, 1 set of 3 reps, pt requires at least one UE support for completion  -Supine bridges, 1 set of 10 reps, 5 second holds   05/18/24 Supine: Ankle pumps x 10 Quad sets 5 x 10 SLR 2 x 10 Right Heel slides x 5 AROM; 80 degrees Right Heels slides with strap 5 hold x 10  Seated  Heel slides x 2' Heel slides with overpressure x 5 Ambulation with SPC in right hand (feels awkward to her in the left) x 1 lap   05/16/2024  Gait training: -220 feet with brace unlocked to 90 degrees with RW, pt cued for increased right knee flexion -220 feet with brace unlocked to 90 degrees with HHA, pt demonstrates decreased stance time on RLE and decreased R stride length  Therapeutic Exercise: -Knee flexion/extension with LLE assist for OP, 2 sets of 7  reps -standing marches 1 set of 10 reps bilaterally -Standing knee flexion AROM, bilaterally, 2 sets of 10 reps -STS, 1 set of 3 reps, pt requires at least one UE support for completion  -Supine bridges, 1 set of 10 reps -DKTC, 3 sets of 10 reps on green theraball, pt cued for use of LLE for OP    PATIENT EDUCATION:  Education details: Pt was educated on findings of PT evaluation, prognosis, frequency of therapy visits and rationale, attendance policy, and HEP if given.   Person educated: Patient and Spouse Education method: Explanation, Verbal cues, and Handouts Education comprehension: verbalized understanding, tactile cues required, and needs further education  HOME EXERCISE PROGRAM:   Access Code: [email or web address] URL: [email or web address] Date: 05/16/2024 Prepared by: Lang Ada  Exercises - Standing Knee Flexion AROM with Chair Support  - 1 x daily - 7 x weekly - 3 sets - 10 reps - Seated Knee Flexion Stretch  - 1 x daily - 7 x weekly - 3 sets - 10 reps - Seated Knee Flexion AAROM  - 1 x daily - 7 x weekly - 3 sets - 10 reps - Seated Knee Extension AAROM  - 1 x daily - 7 x weekly - 3 sets - 10 reps - Supine Bridge  - 1 x daily - 7 x weekly - 3 sets - 10 reps - Standing March with Counter Support  - 1 x daily - 7 x weekly - 3 sets - 10 reps - Sit to Stand with Armchair  - 1 x daily - 7 x weekly - 3 sets - 10 reps  ASSESSMENT:  CLINICAL IMPRESSION: Patient arrives with cane.  She demonstrates good balance with SPC; slightly impaired without the cane.  Encouraged to continue to use for safety.  Needs cues for form with step ups.  Her brace  tends to slide down during treatment despite previous adjustments.  Trial of recumbent bike today; unable to make full revolution on bike but gradually increasing her flexion mobility.Today AROM right knee 95 degrees.   Mild swelling noted right ankle.   Patient would benefit from additional four weeks of skilled physical therapy for  increased endurance with ambulation, increased RLE strength/ROM, and improved balance for improved quality of life, improved independence with gait training and continued progress towards therapy goals.         Eval:Patient is a 73 y.o. female who was seen today for physical therapy evaluation and treatment for S82.031A (ICD-10-CM) - Displaced transverse fracture of right patella, initial encounter for closed fracture.   Patient demonstrates decreased RLE strength, abnormal pain rating, impaired functional mobility, difficulty with ambulation and impaired balance. Patient demonstrates difficulty with ambulation during today's session with heavy dependence on UEs, decreased stride length and velocity noted. Patient also demonstrates difficulty getting out of the car and requires assist of therapist, pt able to walk into clinic with modified independence, RW. Patient requires education on role of PT, plan of care, HEP and fall risk. Pt was not taken out of knee immobilizing brace due to lack of protocol provided and HEP was prescribed on a pain free basis. Patient would benefit from skilled physical therapy for increased endurance with ambulation, increased RLE strength, improved functional mobility and improved balance for improved gait quality, return to higher level of function with ADLs, return to driving, and progress towards therapy goals.   OBJECTIVE IMPAIRMENTS: Abnormal gait, decreased activity tolerance, decreased balance, decreased endurance, decreased knowledge of use of DME, decreased mobility, difficulty walking, decreased ROM, decreased strength, increased edema, and pain.   ACTIVITY LIMITATIONS: carrying, lifting, bending, sitting, standing, squatting, stairs, transfers, bed mobility, bathing, toileting, locomotion level, and driving  PARTICIPATION LIMITATIONS: meal prep, cleaning, laundry, driving, shopping, community activity, occupation, and church  PERSONAL FACTORS: Age, Fitness,  Past/current experiences, Time since onset of injury/illness/exacerbation, and 1 comorbidity: history of falling are also affecting patient's functional outcome.   REHAB POTENTIAL: Good  CLINICAL DECISION MAKING: Stable/uncomplicated  EVALUATION COMPLEXITY: Low   GOALS: Goals reviewed with patient? No  SHORT TERM GOALS: Target date: 05/03/24  Patient will demonstrate evidence of independence with individualized HEP and will report compliance for at least 3 days per week for optimized progression towards remaining therapy goals. Baseline:  Goal status: MET  2.  Patient will report a decrease in pain level during community ambulation by at least 2 points for improved quality of life. Baseline: 3/10 Goal status: MET     LONG TERM GOALS: Target date: 05/24/24  Pt will demonstrate a an increase of at least 9 points on the LEFS for improved performance of community ambulation and ADL. Baseline: see obective Goal status: IN PROGRESS  2.  Pt will improve 2 MWT by 140 in order to demonstrate improved functional ambulatory capacity in community setting.  Baseline: 133 ft with RW Goal status: MET  3.  Pt will demonstrate WFL ROM (flexion and extension) in right knee, for increased mobility and maximal efficiency of gait cycle during ambulation. Baseline: protocol, pt locked at 0 degrees upon presenting to evaluation Goal status: IN PROGRESS  4.  Pt will demonstrate at least 4/5 MMT for right lower extremity for increased strength during ADL and community ambulation. Baseline: see objective Goal status: IN PROGRESS  5.  Pt will improve 5TSTS by 2.3 seconds in order to improve functional strength during functional transfers.  Baseline: see objective Goal status: MET    PLAN:  PT FREQUENCY: 2x/week  PT DURATION: 6 weeks  PLANNED INTERVENTIONS: 97110-Therapeutic exercises, 97530- Therapeutic activity, 97112- Neuromuscular re-education, 97535- Self Care, [id number]- Manual therapy,  [phone]- Gait training, Patient/Family education, Balance training, Stair training, Joint mobilization, Scar mobilization, DME instructions, Cryotherapy, and Moist heat  PLAN FOR NEXT SESSION: progress LE strengthening and R knee ROM flexion specifically per protocl; s/p 8 weeks 7/22  1:39 PM, 05/26/24 Enyah Moman Small Rylee Nuzum MPT [facility] [place] [phone] Ph:[phone]

## 2024-05-29 ENCOUNTER — Ambulatory Visit (HOSPITAL_COMMUNITY)

## 2024-05-29 DIAGNOSIS — Z7409 Other reduced mobility: Secondary | ICD-10-CM

## 2024-05-29 DIAGNOSIS — Z9889 Other specified postprocedural states: Secondary | ICD-10-CM | POA: Diagnosis not present

## 2024-05-29 NOTE — Therapy (Signed)
 OUTPATIENT PHYSICAL THERAPY LOWER EXTREMITY TREATMENT    Patient Name: Gabriella Holmes MRN: [id number] DOB:March 02, 1951, 73 y.o., female Today's Date: 05/29/2024  END OF SESSION:  PT End of Session - 05/29/24 1258     Visit Number 12    Number of Visits 20    Date for PT Re-Evaluation 06/21/24    Authorization Type [employer]    Authorization Time Period no auth required    Progress Note Due on Visit 20    PT Start Time 1300    PT Stop Time 1340    PT Time Calculation (min) 40 min    Activity Tolerance Patient tolerated treatment well    Behavior During Therapy WFL for tasks assessed/performed                Past Medical History:  Diagnosis Date   GERD (gastroesophageal reflux disease)    High cholesterol    Hypertension    Type II diabetes mellitus (HCC)    Past Surgical History:  Procedure Laterality Date   BACK SURGERY     CARDIAC CATHETERIZATION  2001   CATARACT EXTRACTION W/ INTRAOCULAR LENS  IMPLANT, BILATERAL Bilateral ~ 2011   CHOLECYSTECTOMY  ~ 2012   COLONOSCOPY N/A 01/09/2014   Procedure: COLONOSCOPY;  Surgeon: Lamar CHRISTELLA Hollingshead, MD;  Location: [facility];  Service: Endoscopy;  Laterality: N/A;  7:30 AM-rescheduled to 845 Doris notified pt   CYST EXCISION  1980's   from facial area.    EYE SURGERY     LUMBAR DISC SURGERY  1989   ORIF HUMERUS FRACTURE Right 09/21/2013   Procedure: OPEN REDUCTION INTERNAL FIXATION (ORIF) PROXIMAL HUMERUS FRACTURE;  Surgeon: Eva Elsie Herring, MD;  Location: [facility] OR;  Service: Orthopedics;  Laterality: Right;  Right open reduction internal fixation humerus fracture   ORIF PROXIMAL HUMERUS FRACTURE Right 09/21/2013   PHOTOCOAGULATION WITH LASER Bilateral ~ 2011-2014   for diabetic retinopathy (09/21/2013)   TRIGGER FINGER RELEASE Left 1980's   TUBAL LIGATION  1975   Patient Active Problem List   Diagnosis Date Noted   Diabetic polyneuropathy (HCC) 01/26/2017   Hypercholesterolemia 01/26/2017   Hypertension,  essential 01/26/2017   Type 2 diabetes mellitus with hyperglycemia, with long-term current use of insulin  (HCC) 01/26/2017   Vitamin B12 deficiency 01/26/2017   Vitamin D insufficiency 01/26/2017   Fracture of proximal humerus 09/07/2013   Humerus fracture 09/04/2013   Vitreous hemorrhage, right (HCC) 03/02/2013   Posterior vitreous detachment 06/27/2012   Pseudophakia 06/27/2012   Nuclear sclerotic cataract of both eyes 08/06/2011   Proliferative diabetic retinopathy of both eyes without macular edema associated with type 2 diabetes mellitus (HCC) 08/06/2011    PCP: Sheryle Carwin, MD   REFERRING PROVIDER: Adair Lamar Hummer, MD  REFERRING DIAG: S82.031A (ICD-10-CM) - Displaced transverse fracture of right patella, initial encounter for closed fracture  THERAPY DIAG:  S/P right knee surgery  Impaired functional mobility, balance, gait, and endurance  Rationale for Evaluation and Treatment: Rehabilitation  ONSET DATE: 04/04/24 (Surgery)  SUBJECTIVE:   SUBJECTIVE STATEMENT: Arrives with [facility] but not really using it.  Patient reports no pain.  Patient will be s/p 8 weeks on 05/30/24.  Patient returns to MD August 25th; mild swelling noted right ankle  Evaluation: May the 16th fell at the [facility] for husbands appointment. Fell in the hallway due to tripping over own feet. Pt also describes falling in 2014 and breaking her arm. Pt states she would like to improve her balance during therapy episode.  Pt states she has a follow up appointment at [facility] hospital Monday and will obtain protocol at that time.   PERTINENT HISTORY: Shoulder surgery due to another fall  PAIN:  Are you having pain? Yes: NPRS scale: 3-4/10 Pain location: front of right knee Pain description: dull, tight feeling Aggravating factors: tenderness Relieving factors: Advil and tylenol , ice, elevation  PRECAUTIONS: Knee, Fall, and Other: PROTOCOL UNDER MEDIA  RED FLAGS: Pt states she has had stomach problems  since before the surgery.   WEIGHT BEARING RESTRICTIONS: Yes WBAT per patient   FALLS:  Has patient fallen in last 6 months? Yes. 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  DIAGNOSTIC FINDINGS: none on chart  PATIENT SURVEYS:  LEFS: 36/80  COGNITION: Overall cognitive status: Within functional limits for tasks assessed     SENSATION: [facility] Not tested at incision  EDEMA:  In immobilizer, TBA when cleared immobilizer off  PALPATION: Pt still with surgical bandaging, slight bruising on front, increased bruising on the back  LOWER EXTREMITY ROM:  Active ROM Right eval Left eval Right  05/09/24 Right 05/18/24  Hip flexion      Hip extension      Hip abduction      Hip adduction      Hip internal rotation      Hip external rotation      Knee flexion TBA TBA 65 80, 85 AAROM  Knee extension TBA TBA 0   Ankle dorsiflexion      Ankle plantarflexion      Ankle inversion      Ankle eversion       (Blank rows = not tested)  LOWER EXTREMITY MMT:  MMT Right eval Left eval  Hip flexion TBA 3+  Hip extension    Hip abduction    Hip adduction    Hip internal rotation    Hip external rotation    Knee flexion TBA 4-  Knee extension TBA 4-  Ankle dorsiflexion 4 4  Ankle plantarflexion    Ankle inversion    Ankle eversion     (Blank rows = not tested)    FUNCTIONAL TESTS:  5 times sit to stand: 37.63 seconds 2 minute walk test: 215 feet, RW and with knee brace  05/24/24:  :  335 feet, RW and knee  brace 5TSTS: 21.81 seconds   GAIT: Distance walked: 75 feet in and out from treatment area Assistive device utilized: Environmental consultant - 2 wheeled Level of assistance: Modified independence Comments: Pt demonstrates antalgic gait pattern, could be influenced by 0 degree locking of immobilizing brace on RLE. Decreased stride length and velocity noted.  TREATMENT DATE:  05/29/24 Bike seat 12 x 8' half revolutions Heel raises 2 x 10 Slant board 5 x 20 Mini squats to chair for target  2 x 10 12 step knee drives for flexion x 2' AROM right knee flexion in supine 105 degrees today Ended with bike seat 13 able to make full backwards revolutions x 10    05/26/24 1 lap around PT gym with cane Step navigation up and down 7 steps with 1 hand rail and SPC Heel raises 2 x 10 6 step ups 2 x 10 AROM right knee in supine 95 degrees Bike seat 12 half revolutions x 5' 6 step right knee drives for flexion x 2' Slant board 5 x 20   05/24/2024  Progress note:  , 5TSTS, LEFS, Protocol reviewed Gait training: -335 feet with brace unlocked to 90 degrees with RW, pt cued for pain free right knee flexion Therapeutic Exercise: -SLR 2 x 10 -Right Heel slides x 5 AROM; 92 degrees -Right Heels slides with strap 5 hold x 10 -Standing knee flexion AROM, bilaterally, 2 sets of 10 reps -Standing marches, 1 set of 10 reps bilaterally -STS, 1 set of 3 reps, pt requires at least one UE support for completion  -Supine bridges, 1 set of 10 reps, 5 second holds   05/18/24 Supine: Ankle pumps x 10 Quad sets 5 x 10 SLR 2 x 10 Right Heel slides x 5 AROM; 80 degrees Right Heels slides with strap 5 hold x 10  Seated  Heel slides x 2' Heel slides with overpressure x 5 Ambulation with SPC in right hand (feels awkward to her in the left) x 1 lap   05/16/2024  Gait training: -220  feet with brace unlocked to 90 degrees with RW, pt cued for increased right knee flexion -220 feet with brace unlocked to 90 degrees with HHA, pt demonstrates decreased stance time on RLE and decreased R stride length  Therapeutic Exercise: -Knee flexion/extension with LLE assist for OP, 2 sets of 7 reps -standing marches 1 set of 10 reps bilaterally -Standing knee flexion AROM, bilaterally, 2 sets of 10 reps -STS, 1 set of 3 reps, pt requires at least one UE support for completion  -Supine bridges, 1 set of 10 reps -DKTC, 3 sets of 10 reps on green theraball, pt cued for use of LLE for OP    PATIENT EDUCATION:  Education details: Pt was educated on findings of PT evaluation, prognosis, frequency of therapy visits and rationale, attendance policy, and HEP if given.   Person educated: Patient and Spouse Education method: Explanation, Verbal cues, and Handouts Education comprehension: verbalized understanding, tactile cues required, and needs further education  HOME EXERCISE PROGRAM:   Access Code: [email or web address] URL: [email or web address] Date: 05/16/2024 Prepared by: Lang Ada  Exercises - Standing Knee Flexion AROM with Chair Support  - 1 x daily - 7 x weekly - 3 sets - 10 reps - Seated Knee Flexion Stretch  - 1 x daily - 7 x weekly - 3 sets - 10 reps - Seated Knee Flexion AAROM  - 1 x daily - 7 x weekly - 3 sets - 10 reps - Seated Knee Extension AAROM  - 1 x daily - 7 x weekly - 3 sets - 10 reps - Supine Bridge  - 1 x daily - 7 x weekly - 3 sets - 10 reps - Standing March with Counter Support  - 1 x daily - 7 x weekly - 3 sets - 10 reps -  Sit to Stand with Armchair  - 1 x daily - 7 x weekly - 3 sets - 10 reps  ASSESSMENT:  CLINICAL IMPRESSION: Today's session with continued focus on right lower extremity mobility and strength per protocol. She demonstrates improved knee flexion today to 105 degrees and is able to make full backwards revolution on bike at the end of  treatment today.  Walk in PT gym without brace with SPC.  No loss of balance or path deviation noted with ambulation.  Patient would benefit from additional skilled physical therapy for increased endurance with ambulation, increased RLE strength/ROM, and improved balance for improved quality of life, improved independence with gait training and continued progress towards therapy goals.         Eval:Patient is a 73 y.o. female who was seen today for physical therapy evaluation and treatment for S82.031A (ICD-10-CM) - Displaced transverse fracture of right patella, initial encounter for closed fracture.   Patient demonstrates decreased RLE strength, abnormal pain rating, impaired functional mobility, difficulty with ambulation and impaired balance. Patient demonstrates difficulty with ambulation during today's session with heavy dependence on UEs, decreased stride length and velocity noted. Patient also demonstrates difficulty getting out of the car and requires assist of therapist, pt able to walk into clinic with modified independence, RW. Patient requires education on role of PT, plan of care, HEP and fall risk. Pt was not taken out of knee immobilizing brace due to lack of protocol provided and HEP was prescribed on a pain free basis. Patient would benefit from skilled physical therapy for increased endurance with ambulation, increased RLE strength, improved functional mobility and improved balance for improved gait quality, return to higher level of function with ADLs, return to driving, and progress towards therapy goals.   OBJECTIVE IMPAIRMENTS: Abnormal gait, decreased activity tolerance, decreased balance, decreased endurance, decreased knowledge of use of DME, decreased mobility, difficulty walking, decreased ROM, decreased strength, increased edema, and pain.   ACTIVITY LIMITATIONS: carrying, lifting, bending, sitting, standing, squatting, stairs, transfers, bed mobility, bathing, toileting,  locomotion level, and driving  PARTICIPATION LIMITATIONS: meal prep, cleaning, laundry, driving, shopping, community activity, occupation, and church  PERSONAL FACTORS: Age, Fitness, Past/current experiences, Time since onset of injury/illness/exacerbation, and 1 comorbidity: history of falling are also affecting patient's functional outcome.   REHAB POTENTIAL: Good  CLINICAL DECISION MAKING: Stable/uncomplicated  EVALUATION COMPLEXITY: Low   GOALS: Goals reviewed with patient? No  SHORT TERM GOALS: Target date: 05/03/24  Patient will demonstrate evidence of independence with individualized HEP and will report compliance for at least 3 days per week for optimized progression towards remaining therapy goals. Baseline:  Goal status: MET  2.  Patient will report a decrease in pain level during community ambulation by at least 2 points for improved quality of life. Baseline: 3/10 Goal status: MET     LONG TERM GOALS: Target date: 05/24/24  Pt will demonstrate a an increase of at least 9 points on the LEFS for improved performance of community ambulation and ADL. Baseline: see obective Goal status: IN PROGRESS  2.  Pt will improve 2 MWT by 140 in order to demonstrate improved functional ambulatory capacity in community setting.  Baseline: 133 ft with RW Goal status: MET  3.  Pt will demonstrate WFL ROM (flexion and extension) in right knee, for increased mobility and maximal efficiency of gait cycle during ambulation. Baseline: protocol, pt locked at 0 degrees upon presenting to evaluation Goal status: IN PROGRESS  4.  Pt will demonstrate at least  4/5 MMT for right lower extremity for increased strength during ADL and community ambulation. Baseline: see objective Goal status: IN PROGRESS  5.  Pt will improve 5TSTS by 2.3 seconds in order to improve functional strength during functional transfers. Baseline: see objective Goal status: MET    PLAN:  PT FREQUENCY:  2x/week  PT DURATION: 6 weeks  PLANNED INTERVENTIONS: 97110-Therapeutic exercises, 97530- Therapeutic activity, 97112- Neuromuscular re-education, 97535- Self Care, [id number]- Manual therapy, [phone]- Gait training, Patient/Family education, Balance training, Stair training, Joint mobilization, Scar mobilization, DME instructions, Cryotherapy, and Moist heat  PLAN FOR NEXT SESSION: progress LE strengthening and R knee ROM flexion specifically per protocl; s/p 8 weeks 7/22  1:46 PM, 05/29/24 Uno Esau Small Adrianah Prophete MPT [facility] [place] [phone] [phone]

## 2024-06-01 ENCOUNTER — Ambulatory Visit (HOSPITAL_COMMUNITY): Admitting: Physical Therapy

## 2024-06-01 DIAGNOSIS — Z7409 Other reduced mobility: Secondary | ICD-10-CM

## 2024-06-01 DIAGNOSIS — Z9889 Other specified postprocedural states: Secondary | ICD-10-CM | POA: Diagnosis not present

## 2024-06-01 NOTE — Therapy (Signed)
 OUTPATIENT PHYSICAL THERAPY LOWER EXTREMITY TREATMENT    Patient Name: Gabriella Holmes MRN: [id number] DOB:1951-05-22, 73 y.o., female Today's Date: 06/01/2024  END OF SESSION:  PT End of Session - 06/01/24 1510     Visit Number 13    Number of Visits 20    Date for PT Re-Evaluation 06/21/24    Authorization Type [employer]    Authorization Time Period no auth required    Progress Note Due on Visit 20    PT Start Time 1302    PT Stop Time 1346    PT Time Calculation (min) 44 min    Activity Tolerance Patient tolerated treatment well    Behavior During Therapy WFL for tasks assessed/performed                 Past Medical History:  Diagnosis Date   GERD (gastroesophageal reflux disease)    High cholesterol    Hypertension    Type II diabetes mellitus (HCC)    Past Surgical History:  Procedure Laterality Date   BACK SURGERY     CARDIAC CATHETERIZATION  2001   CATARACT EXTRACTION W/ INTRAOCULAR LENS  IMPLANT, BILATERAL Bilateral ~ 2011   CHOLECYSTECTOMY  ~ 2012   COLONOSCOPY N/A 01/09/2014   Procedure: COLONOSCOPY;  Surgeon: Lamar CHRISTELLA Hollingshead, MD;  Location: [facility];  Service: Endoscopy;  Laterality: N/A;  7:30 AM-rescheduled to [address]45 [address] notified pt   CYST EXCISION  1980's   from facial area.    EYE SURGERY     LUMBAR DISC SURGERY  1989   ORIF HUMERUS FRACTURE Right 09/21/2013   Procedure: OPEN REDUCTION INTERNAL FIXATION (ORIF) PROXIMAL HUMERUS FRACTURE;  Surgeon: Eva Elsie Herring, MD;  Location: [facility] OR;  Service: Orthopedics;  Laterality: Right;  Right open reduction internal fixation humerus fracture   ORIF PROXIMAL HUMERUS FRACTURE Right 09/21/2013   PHOTOCOAGULATION WITH LASER Bilateral ~ 2011-2014   for diabetic retinopathy (09/21/2013)   TRIGGER FINGER RELEASE Left 1980's   TUBAL LIGATION  1975   Patient Active Problem List   Diagnosis Date Noted   Diabetic polyneuropathy (HCC) 01/26/2017   Hypercholesterolemia 01/26/2017   Hypertension,  essential 01/26/2017   Type 2 diabetes mellitus with hyperglycemia, with long-term current use of insulin  (HCC) 01/26/2017   Vitamin B12 deficiency 01/26/2017   Vitamin D insufficiency 01/26/2017   Fracture of proximal humerus 09/07/2013   Humerus fracture 09/04/2013   Vitreous hemorrhage, right (HCC) 03/02/2013   Posterior vitreous detachment 06/27/2012   Pseudophakia 06/27/2012   Nuclear sclerotic cataract of both eyes 08/06/2011   Proliferative diabetic retinopathy of both eyes without macular edema associated with type 2 diabetes mellitus (HCC) 08/06/2011    PCP: Sheryle Carwin, MD   REFERRING PROVIDER: Adair Lamar Hummer, MD  REFERRING DIAG: S82.031A (ICD-10-CM) - Displaced transverse fracture of right patella, initial encounter for closed fracture  THERAPY DIAG:  S/P right knee surgery  Impaired functional mobility, balance, gait, and endurance  Rationale for Evaluation and Treatment: Rehabilitation  ONSET DATE: 04/04/24 (Surgery)  SUBJECTIVE:   SUBJECTIVE STATEMENT: Arrives with SPC and brace donned.  Reports no pain and overall doing well. Pt is now s/p 8 weeks.  Patient returns to MD August 25th.  Evaluation: May the 16th fell at the [facility] for husbands appointment. Fell in the hallway due to tripping over own feet. Pt also describes falling in 2014 and breaking her arm. Pt states she would like to improve her balance during therapy episode. Pt states she has a follow  up appointment at [facility] hospital Monday and will obtain protocol at that time.   PERTINENT HISTORY: Shoulder surgery due to another fall  PAIN:  Are you having pain? No  PRECAUTIONS: Knee, Fall, and Other: PROTOCOL UNDER MEDIA  RED FLAGS: Pt states she has had stomach problems since before the surgery.   WEIGHT BEARING RESTRICTIONS: Yes WBAT per patient   FALLS:  Has patient fallen in last 6 months? Yes. 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  DIAGNOSTIC FINDINGS: none on chart  PATIENT SURVEYS:  LEFS: 36/80  COGNITION: Overall cognitive status: Within functional limits for tasks assessed     SENSATION: [facility] Not tested at incision  EDEMA:  In immobilizer, TBA when cleared immobilizer off  PALPATION: Pt still with surgical bandaging, slight bruising on front, increased bruising on the back  LOWER EXTREMITY ROM:  Active ROM Right eval Left eval Right  05/09/24 Right 05/18/24 Right 06/01/24  Hip flexion       Hip extension       Hip abduction       Hip adduction       Hip internal rotation       Hip external rotation       Knee flexion TBA TBA 65 80, 85 AAROM AROM 105 AAROM 110  Knee extension TBA TBA 0    Ankle dorsiflexion       Ankle plantarflexion       Ankle inversion       Ankle eversion        (Blank rows = not tested)  LOWER EXTREMITY MMT:  MMT Right eval Left eval  Hip flexion TBA 3+  Hip extension    Hip abduction    Hip adduction    Hip internal rotation    Hip external rotation    Knee flexion TBA 4-  Knee extension TBA 4-  Ankle dorsiflexion 4 4  Ankle plantarflexion    Ankle inversion    Ankle eversion     (Blank rows = not tested)    FUNCTIONAL TESTS:  5 times sit to stand: 37.63 seconds 2 minute walk test: 215 feet, RW and with knee brace  05/24/24:  :  335 feet, RW and knee brace 5TSTS: 21.81 seconds   GAIT: Distance walked: 75 feet in and out from treatment area Assistive device utilized: Environmental consultant - 2 wheeled Level of assistance: Modified  independence Comments: Pt demonstrates antalgic gait pattern, could be influenced by 0 degree locking of immobilizing brace on RLE. Decreased stride length and velocity noted.  TREATMENT DATE:  06/01/24 Heel raises on incline 20X Slant board 3x 20 Mini squats to chair for target  2 x 10 12 step knee drives for flexion 10X10 holds Rt leading on 4 step lunges in good form, no UE assist 2X10 Bike seat  13 full rev 6 minutes AROM right knee flexion in supine 105, AAROM 110  05/29/24 Bike seat 12 x 8' half revolutions Heel raises 2 x 10 Slant board 5 x 20 Mini squats to chair for target  2 x 10 12 step knee drives for flexion x 2' AROM right knee flexion in supine 105 degrees today Ended with bike seat 13 able to make full backwards revolutions x 10  05/26/24 1 lap around PT gym with cane Step navigation up and down 7 steps with 1 hand rail and SPC Heel raises 2 x 10 6 step ups 2 x 10 AROM right knee in supine 95 degrees Bike seat 12 half revolutions x 5' 6 step right knee drives for flexion x 2' Slant board 5 x 20    PATIENT EDUCATION:  Education details: Pt was educated on findings of PT evaluation, prognosis, frequency of therapy visits and rationale, attendance policy, and HEP if given.   Person educated: Patient and Spouse Education method: Explanation, Verbal cues, and Handouts Education comprehension: verbalized understanding, tactile cues required, and needs further education  HOME EXERCISE PROGRAM:   Access Code: [email or web address] URL: [email or web address] Date: 05/16/2024 Prepared by: Lang Ada  Exercises - Standing Knee Flexion AROM with Chair Support  - 1 x daily - 7 x weekly - 3 sets - 10 reps - Seated Knee Flexion Stretch  - 1 x daily - 7 x weekly - 3 sets - 10 reps - Seated Knee Flexion AAROM  - 1 x daily - 7 x  weekly - 3 sets - 10 reps - Seated Knee Extension AAROM  - 1 x daily - 7 x weekly - 3 sets - 10 reps - Supine Bridge  - 1 x daily - 7 x weekly - 3 sets - 10 reps - Standing March with Counter Support  - 1 x daily - 7 x weekly - 3 sets - 10 reps - Sit to Stand with Armchair  - 1 x daily - 7 x weekly - 3 sets - 10 reps  ASSESSMENT:  CLINICAL IMPRESSION: Continue with focus on improving Rt LE ROM and strength.  Exercises completed without brace donned.  Began with standing stretches.  Added Rt LE forward lunges using 4 step, intermittent HHA to work on stability.  Increased ease with making full revol today on bike with seat closer at 13.  Pt now in phase III of protocol, however not much difference between phase II and III as far as therex.  Manual completed, specifically myofascial techniques to lateral knee.  Pt reported no pain or discomfort with therex or manual today.  demonstrates improved knee flexion today to 105 degrees AROM but able to assist further to 110.  Pt will continue to benefit from skilled therapy. .   Eval:Patient is a 73 y.o. female who was seen today for physical therapy evaluation and treatment for S82.031A (ICD-10-CM) - Displaced transverse fracture of right patella, initial encounter for closed fracture.   Patient demonstrates decreased RLE strength, abnormal pain rating, impaired functional mobility, difficulty with ambulation and impaired balance. Patient demonstrates difficulty with ambulation during today's session with heavy dependence on UEs, decreased stride length and velocity noted. Patient also demonstrates difficulty getting out  of the car and requires assist of therapist, pt able to walk into clinic with modified independence, RW. Patient requires education on role of PT, plan of care, HEP and fall risk. Pt was not taken out of knee immobilizing brace due to lack of protocol provided and HEP was prescribed on a pain free basis. Patient would benefit from skilled  physical therapy for increased endurance with ambulation, increased RLE strength, improved functional mobility and improved balance for improved gait quality, return to higher level of function with ADLs, return to driving, and progress towards therapy goals.   OBJECTIVE IMPAIRMENTS: Abnormal gait, decreased activity tolerance, decreased balance, decreased endurance, decreased knowledge of use of DME, decreased mobility, difficulty walking, decreased ROM, decreased strength, increased edema, and pain.   ACTIVITY LIMITATIONS: carrying, lifting, bending, sitting, standing, squatting, stairs, transfers, bed mobility, bathing, toileting, locomotion level, and driving  PARTICIPATION LIMITATIONS: meal prep, cleaning, laundry, driving, shopping, community activity, occupation, and church  PERSONAL FACTORS: Age, Fitness, Past/current experiences, Time since onset of injury/illness/exacerbation, and 1 comorbidity: history of falling are also affecting patient's functional outcome.   REHAB POTENTIAL: Good  CLINICAL DECISION MAKING: Stable/uncomplicated  EVALUATION COMPLEXITY: Low   GOALS: Goals reviewed with patient? No  SHORT TERM GOALS: Target date: 05/03/24  Patient will demonstrate evidence of independence with individualized HEP and will report compliance for at least 3 days per week for optimized progression towards remaining therapy goals. Baseline:  Goal status: MET  2.  Patient will report a decrease in pain level during community ambulation by at least 2 points for improved quality of life. Baseline: 3/10 Goal status: MET     LONG TERM GOALS: Target date: 05/24/24  Pt will demonstrate a an increase of at least 9 points on the LEFS for improved performance of community ambulation and ADL. Baseline: see obective Goal status: IN PROGRESS  2.  Pt will improve 2 MWT by 140 in order to demonstrate improved functional ambulatory capacity in community setting.  Baseline: 133 ft with  RW Goal status: MET  3.  Pt will demonstrate WFL ROM (flexion and extension) in right knee, for increased mobility and maximal efficiency of gait cycle during ambulation. Baseline: protocol, pt locked at 0 degrees upon presenting to evaluation Goal status: IN PROGRESS  4.  Pt will demonstrate at least 4/5 MMT for right lower extremity for increased strength during ADL and community ambulation. Baseline: see objective Goal status: IN PROGRESS  5.  Pt will improve 5TSTS by 2.3 seconds in order to improve functional strength during functional transfers. Baseline: see objective Goal status: MET    PLAN:  PT FREQUENCY: 2x/week  PT DURATION: 6 weeks  PLANNED INTERVENTIONS: 97110-Therapeutic exercises, 97530- Therapeutic activity, 97112- Neuromuscular re-education, 97535- Self Care, [id number]- Manual therapy, [phone]- Gait training, Patient/Family education, Balance training, Stair training, Joint mobilization, Scar mobilization, DME instructions, Cryotherapy, and Moist heat  PLAN FOR NEXT SESSION: progress LE strengthening and R knee ROM flexion specifically per protocol.  s/p 10 weeks 8/5  3:11 PM, 06/01/24 Greig KATHEE Fuse, PTA/CLT [facility] Ph: [phone]

## 2024-06-05 ENCOUNTER — Ambulatory Visit (HOSPITAL_COMMUNITY)

## 2024-06-05 DIAGNOSIS — Z9889 Other specified postprocedural states: Secondary | ICD-10-CM | POA: Diagnosis not present

## 2024-06-05 DIAGNOSIS — Z7409 Other reduced mobility: Secondary | ICD-10-CM

## 2024-06-05 NOTE — Therapy (Signed)
 OUTPATIENT PHYSICAL THERAPY LOWER EXTREMITY TREATMENT    Patient Name: Gabriella Holmes MRN: [id number] DOB:Jul 25, 1951, 73 y.o., female Today's Date: 06/05/2024  END OF SESSION:  PT End of Session - 06/05/24 0932     Visit Number 14    Number of Visits 20    Date for PT Re-Evaluation 06/21/24    Authorization Type [employer]    Authorization Time Period no auth required    PT Start Time 0933    PT Stop Time 1015    PT Time Calculation (min) 42 min    Activity Tolerance Patient tolerated treatment well    Behavior During Therapy [facility] for tasks assessed/performed                 Past Medical History:  Diagnosis Date   GERD (gastroesophageal reflux disease)    High cholesterol    Hypertension    Type II diabetes mellitus (HCC)    Past Surgical History:  Procedure Laterality Date   BACK SURGERY     CARDIAC CATHETERIZATION  2001   CATARACT EXTRACTION W/ INTRAOCULAR LENS  IMPLANT, BILATERAL Bilateral ~ 2011   CHOLECYSTECTOMY  ~ 2012   COLONOSCOPY N/A 01/09/2014   Procedure: COLONOSCOPY;  Surgeon: Lamar CHRISTELLA Hollingshead, MD;  Location: [facility];  Service: Endoscopy;  Laterality: N/A;  7:30 AM-rescheduled to [address]45 Doris notified pt   CYST EXCISION  1980's   from facial area.    EYE SURGERY     LUMBAR DISC SURGERY  1989   ORIF HUMERUS FRACTURE Right 09/21/2013   Procedure: OPEN REDUCTION INTERNAL FIXATION (ORIF) PROXIMAL HUMERUS FRACTURE;  Surgeon: Eva Elsie Herring, MD;  Location: [facility] OR;  Service: Orthopedics;  Laterality: Right;  Right open reduction internal fixation humerus fracture   ORIF PROXIMAL HUMERUS FRACTURE Right 09/21/2013   PHOTOCOAGULATION WITH LASER Bilateral ~ 2011-2014   for diabetic retinopathy (09/21/2013)   TRIGGER FINGER RELEASE Left 1980's   TUBAL LIGATION  1975   Patient Active Problem List   Diagnosis Date Noted   Diabetic polyneuropathy (HCC) 01/26/2017   Hypercholesterolemia 01/26/2017   Hypertension, essential 01/26/2017   Type 2  diabetes mellitus with hyperglycemia, with long-term current use of insulin  (HCC) 01/26/2017   Vitamin B12 deficiency 01/26/2017   Vitamin D insufficiency 01/26/2017   Fracture of proximal humerus 09/07/2013   Humerus fracture 09/04/2013   Vitreous hemorrhage, right (HCC) 03/02/2013   Posterior vitreous detachment 06/27/2012   Pseudophakia 06/27/2012   Nuclear sclerotic cataract of both eyes 08/06/2011   Proliferative diabetic retinopathy of both eyes without macular edema associated with type 2 diabetes mellitus (HCC) 08/06/2011    PCP: Sheryle Carwin, MD   REFERRING PROVIDER: Adair Lamar Hummer, MD  REFERRING DIAG: S82.031A (ICD-10-CM) - Displaced transverse fracture of right patella, initial encounter for closed fracture  THERAPY DIAG:  S/P right knee surgery  Impaired functional mobility, balance, gait, and endurance  Rationale for Evaluation and Treatment: Rehabilitation  ONSET DATE: 04/04/24 (Surgery)  SUBJECTIVE:   SUBJECTIVE STATEMENT: Arrives with cane in hand but no need for using it, just a safety cane mostly. Has had no pain and still doing pretty good since last visit, no soreness just a little tightness.   Evaluation: May the 16th fell at the [facility] for husbands appointment. Fell in the hallway due to tripping over own feet. Pt also describes falling in 2014 and breaking her arm. Pt states she would like to improve her balance during therapy episode. Pt states she has a follow up  appointment at [facility] hospital Monday and will obtain protocol at that time.   PERTINENT HISTORY: Shoulder surgery due to another fall  PAIN:  Are you having pain? No  PRECAUTIONS: Knee, Fall, and Other: PROTOCOL UNDER MEDIA  RED FLAGS: Pt states she has had stomach problems since before the surgery.   WEIGHT BEARING RESTRICTIONS: Yes WBAT per patient   FALLS:  Has patient fallen in last 6 months? Yes. 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  DIAGNOSTIC FINDINGS: none on chart  PATIENT SURVEYS:  LEFS: 36/80  COGNITION: Overall cognitive status: Within functional limits for tasks assessed     SENSATION: [facility] Not tested at incision  EDEMA:  In immobilizer, TBA when cleared immobilizer off  PALPATION: Pt still with surgical bandaging, slight bruising on front, increased bruising on the back  LOWER EXTREMITY ROM:  Active ROM Right eval Left eval Right  05/09/24 Right 05/18/24 Right 06/01/24 Right 06/05/24  Hip flexion        Hip extension        Hip abduction        Hip adduction        Hip internal rotation        Hip external rotation        Knee flexion TBA TBA 65 80, 85 AAROM AROM 105 AAROM 110 AROM  Knee extension TBA TBA 0   0  Ankle dorsiflexion        Ankle plantarflexion        Ankle inversion        Ankle eversion         (Blank rows = not tested)  LOWER EXTREMITY MMT:  MMT Right eval Left eval  Hip flexion TBA 3+  Hip extension    Hip abduction    Hip adduction    Hip internal rotation    Hip external rotation    Knee flexion TBA 4-  Knee extension TBA 4-  Ankle dorsiflexion 4 4  Ankle plantarflexion    Ankle inversion    Ankle eversion     (Blank rows = not tested)    FUNCTIONAL TESTS:  5 times sit to stand: 37.63 seconds 2 minute walk test: 215 feet, RW and with knee brace  05/24/24:  :  335 feet, RW and knee brace 5TSTS: 21.81 seconds  GAIT: Distance walked: 75 feet in and out from treatment area Assistive device utilized: Environmental consultant - 2  wheeled Level of assistance: Modified independence Comments: Pt demonstrates antalgic gait pattern, could be influenced by 0 degree locking of immobilizing brace on RLE. Decreased stride length and velocity noted.  TREATMENT DATE:  06/05/24 Gait w/o use of AD x 250'  Bike seat 12 full rev 6 minutes Stair stretch x 15 repetitions Stair engagement with up with R LE then Heel raises on incline 20X Slant board 3x 20 Mini squats to chair for target  2 x 10 Balance tandem stance (2 x 30s each way) AROM right knee flexion in seated 105, AAROM 107  06/01/24 Heel raises on incline 20X Slant board 3x 20 Mini squats to chair for target  2 x 10 12 step knee drives for flexion 10X10 holds Rt leading on 4 step lunges in good form, no UE assist 2X10 Bike seat  13 full rev 6 minutes AROM right knee flexion in supine 105, AAROM 110  05/29/24 Bike seat 12 x 8' half revolutions Heel raises 2 x 10 Slant board 5 x 20 Mini squats to chair for target  2 x 10 12 step knee drives for flexion x 2' AROM right knee flexion in supine 105 degrees today Ended with bike seat 13 able to make full backwards revolutions x 10  PATIENT EDUCATION:  Education details: Pt was educated on findings of PT evaluation, prognosis, frequency of therapy visits and rationale, attendance policy, and HEP if given.   Person educated: Patient and Spouse Education method: Explanation, Verbal cues, and Handouts Education comprehension: verbalized understanding, tactile cues required, and needs further education  HOME EXERCISE PROGRAM:   Access Code: [email or web address] URL: [email or web address] Date: 05/16/2024 Prepared by: Lang Ada  Exercises - Standing Knee Flexion AROM with Chair Support  - 1 x daily - 7 x weekly - 3 sets - 10 reps - Seated Knee Flexion Stretch  - 1 x daily - 7 x  weekly - 3 sets - 10 reps - Seated Knee Flexion AAROM  - 1 x daily - 7 x weekly - 3 sets - 10 reps - Seated Knee Extension AAROM  - 1 x daily - 7 x weekly - 3 sets - 10 reps - Supine Bridge  - 1 x daily - 7 x weekly - 3 sets - 10 reps - Standing March with Counter Support  - 1 x daily - 7 x weekly - 3 sets - 10 reps - Sit to Stand with Armchair  - 1 x daily - 7 x weekly - 3 sets - 10 reps  ASSESSMENT:  CLINICAL IMPRESSION: Continue with focus on improving functional knee flexion as well as addressing gait/balance and stair navigation with safety. Demonstrates continued tightness and restriction with PROM/AAROM in knee flexion and decreased strength in R LE as indicated by need for BUE for sit to stand with stagger stance as well as  BUE needed for step up with R LE leading. Due to continued deficits, pt will continue to benefit from skilled therapy.    Eval:Patient is a 73 y.o. female who was seen today for physical therapy evaluation and treatment for S82.031A (ICD-10-CM) - Displaced transverse fracture of right patella, initial encounter for closed fracture.   Patient demonstrates decreased RLE strength, abnormal pain rating, impaired functional mobility, difficulty with ambulation and impaired balance. Patient demonstrates difficulty with ambulation during today's session with heavy dependence on UEs, decreased stride length and velocity noted. Patient also demonstrates difficulty getting out of the car and requires assist of therapist, pt able to walk into clinic with modified independence, RW. Patient requires education on role of PT, plan of care, HEP and fall risk. Pt was not taken out of knee immobilizing brace due to lack  of protocol provided and HEP was prescribed on a pain free basis. Patient would benefit from skilled physical therapy for increased endurance with ambulation, increased RLE strength, improved functional mobility and improved balance for improved gait quality, return to  higher level of function with ADLs, return to driving, and progress towards therapy goals.   OBJECTIVE IMPAIRMENTS: Abnormal gait, decreased activity tolerance, decreased balance, decreased endurance, decreased knowledge of use of DME, decreased mobility, difficulty walking, decreased ROM, decreased strength, increased edema, and pain.   ACTIVITY LIMITATIONS: carrying, lifting, bending, sitting, standing, squatting, stairs, transfers, bed mobility, bathing, toileting, locomotion level, and driving  PARTICIPATION LIMITATIONS: meal prep, cleaning, laundry, driving, shopping, community activity, occupation, and church  PERSONAL FACTORS: Age, Fitness, Past/current experiences, Time since onset of injury/illness/exacerbation, and 1 comorbidity: history of falling are also affecting patient's functional outcome.   REHAB POTENTIAL: Good  CLINICAL DECISION MAKING: Stable/uncomplicated  EVALUATION COMPLEXITY: Low   GOALS: Goals reviewed with patient? No  SHORT TERM GOALS: Target date: 05/03/24  Patient will demonstrate evidence of independence with individualized HEP and will report compliance for at least 3 days per week for optimized progression towards remaining therapy goals. Baseline:  Goal status: MET  2.  Patient will report a decrease in pain level during community ambulation by at least 2 points for improved quality of life. Baseline: 3/10 Goal status: MET     LONG TERM GOALS: Target date: 05/24/24  Pt will demonstrate a an increase of at least 9 points on the LEFS for improved performance of community ambulation and ADL. Baseline: see obective Goal status: IN PROGRESS  2.  Pt will improve 2 MWT by 140 in order to demonstrate improved functional ambulatory capacity in community setting.  Baseline: 133 ft with RW Goal status: MET  3.  Pt will demonstrate WFL ROM (flexion and extension) in right knee, for increased mobility and maximal efficiency of gait cycle during  ambulation. Baseline: protocol, pt locked at 0 degrees upon presenting to evaluation Goal status: IN PROGRESS  4.  Pt will demonstrate at least 4/5 MMT for right lower extremity for increased strength during ADL and community ambulation. Baseline: see objective Goal status: IN PROGRESS  5.  Pt will improve 5TSTS by 2.3 seconds in order to improve functional strength during functional transfers. Baseline: see objective Goal status: MET    PLAN:  PT FREQUENCY: 2x/week  PT DURATION: 6 weeks  PLANNED INTERVENTIONS: 97110-Therapeutic exercises, 97530- Therapeutic activity, 97112- Neuromuscular re-education, 97535- Self Care, [id number]- Manual therapy, [phone]- Gait training, Patient/Family education, Balance training, Stair training, Joint mobilization, Scar mobilization, DME instructions, Cryotherapy, and Moist heat  PLAN FOR NEXT SESSION: progress LE strengthening and R knee ROM flexion specifically per protocol.  s/p 10 weeks 8/5  1:39 PM, 06/05/24 Lamarr LITTIE Citrin PT, DPT [facility] [place] [phone] office

## 2024-06-09 ENCOUNTER — Encounter (HOSPITAL_COMMUNITY)

## 2024-06-13 ENCOUNTER — Ambulatory Visit (HOSPITAL_COMMUNITY)

## 2024-06-13 DIAGNOSIS — S82031D Displaced transverse fracture of right patella, subsequent encounter for closed fracture with routine healing: Secondary | ICD-10-CM | POA: Diagnosis present

## 2024-06-13 DIAGNOSIS — Z9889 Other specified postprocedural states: Secondary | ICD-10-CM | POA: Insufficient documentation

## 2024-06-13 DIAGNOSIS — X58XXXD Exposure to other specified factors, subsequent encounter: Secondary | ICD-10-CM | POA: Diagnosis not present

## 2024-06-13 DIAGNOSIS — Z7409 Other reduced mobility: Secondary | ICD-10-CM | POA: Insufficient documentation

## 2024-06-13 NOTE — Therapy (Signed)
 OUTPATIENT PHYSICAL THERAPY LOWER EXTREMITY TREATMENT/DISCHARGE PHYSICAL THERAPY DISCHARGE SUMMARY  Visits from Start of Care: 15  Current functional level related to goals / functional outcomes: See below   Remaining deficits: See below   Education / Equipment: HEP   Patient agrees to discharge. Patient goals were met. Patient is being discharged due to meeting the stated rehab goals.     Patient Name: Gabriella Holmes MRN: [id number] DOB:03/12/1951, 73 y.o., female Today's Date: 06/13/2024  END OF SESSION:  PT End of Session - 06/13/24 0932     Visit Number 15    Number of Visits 20    Date for PT Re-Evaluation 06/21/24    Authorization Type CHAMPVA    Authorization Time Period no auth required    PT Start Time 0930    PT Stop Time 1010    PT Time Calculation (min) 40 min    Activity Tolerance Patient tolerated treatment well    Behavior During Therapy WFL for tasks assessed/performed                 Past Medical History:  Diagnosis Date   GERD (gastroesophageal reflux disease)    High cholesterol    Hypertension    Type II diabetes mellitus (HCC)    Past Surgical History:  Procedure Laterality Date   BACK SURGERY     CARDIAC CATHETERIZATION  2001   CATARACT EXTRACTION W/ INTRAOCULAR LENS  IMPLANT, BILATERAL Bilateral ~ 2011   CHOLECYSTECTOMY  ~ 2012   COLONOSCOPY N/A 01/09/2014   Procedure: COLONOSCOPY;  Surgeon: Lamar CHRISTELLA Hollingshead, MD;  Location: [facility];  Service: Endoscopy;  Laterality: N/A;  7:30 AM-rescheduled to 845 [address] notified pt   CYST EXCISION  1980's   from facial area.    EYE SURGERY     LUMBAR DISC SURGERY  1989   ORIF HUMERUS FRACTURE Right 09/21/2013   Procedure: OPEN REDUCTION INTERNAL FIXATION (ORIF) PROXIMAL HUMERUS FRACTURE;  Surgeon: Eva Elsie Herring, MD;  Location: [facility] OR;  Service: Orthopedics;  Laterality: Right;  Right open reduction internal fixation humerus fracture   ORIF PROXIMAL HUMERUS FRACTURE Right  09/21/2013   PHOTOCOAGULATION WITH LASER Bilateral ~ 2011-2014   for diabetic retinopathy (09/21/2013)   TRIGGER FINGER RELEASE Left 1980's   TUBAL LIGATION  1975   Patient Active Problem List   Diagnosis Date Noted   Diabetic polyneuropathy (HCC) 01/26/2017   Hypercholesterolemia 01/26/2017   Hypertension, essential 01/26/2017   Type 2 diabetes mellitus with hyperglycemia, with long-term current use of insulin  (HCC) 01/26/2017   Vitamin B12 deficiency 01/26/2017   Vitamin D insufficiency 01/26/2017   Fracture of proximal humerus 09/07/2013   Humerus fracture 09/04/2013   Vitreous hemorrhage, right (HCC) 03/02/2013   Posterior vitreous detachment 06/27/2012   Pseudophakia 06/27/2012   Nuclear sclerotic cataract of both eyes 08/06/2011   Proliferative diabetic retinopathy of both eyes without macular edema associated with type 2 diabetes mellitus (HCC) 08/06/2011    PCP: Sheryle Carwin, MD   REFERRING PROVIDER: Adair Lamar Hummer, MD  REFERRING DIAG: S82.031A (ICD-10-CM) - Displaced transverse fracture of right patella, initial encounter for closed fracture  THERAPY DIAG:  S/P right knee surgery  Impaired functional mobility, balance, gait, and endurance  Rationale for Evaluation and Treatment: Rehabilitation  ONSET DATE: 04/04/24 (Surgery)  SUBJECTIVE:   SUBJECTIVE STATEMENT: S/p 10 weeks today.  Thinks she is pretty much back to her baseline  Evaluation: May the 16th fell at the [facility] for husbands appointment. Fell in the  hallway due to tripping over own feet. Pt also describes falling in 2014 and breaking her arm. Pt states she would like to improve her balance during therapy episode. Pt states she has a follow up appointment at [facility] hospital Monday and will obtain protocol at that time.   PERTINENT HISTORY: Shoulder surgery due to another fall  PAIN:  Are you having pain? No  PRECAUTIONS: Knee, Fall, and Other: PROTOCOL UNDER MEDIA  RED FLAGS: Pt states she  has had stomach problems since before the surgery.   WEIGHT BEARING RESTRICTIONS: Yes WBAT per patient   FALLS:  Has patient fallen in last 6 months? Yes. 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  DIAGNOSTIC FINDINGS: none on chart  PATIENT SURVEYS:  LEFS: 36/80  COGNITION: Overall cognitive status: Within functional limits for tasks assessed     SENSATION: [facility] Not tested at incision  EDEMA:  In immobilizer, TBA when cleared immobilizer off  PALPATION: Pt still with surgical bandaging, slight bruising on front, increased bruising on the back  LOWER EXTREMITY ROM:  Active ROM Right eval Left eval Right  05/09/24 Right 05/18/24 Right 06/01/24 Right 06/05/24 Right 06/13/24  Hip flexion         Hip extension         Hip abduction         Hip adduction         Hip internal rotation         Hip external rotation         Knee flexion TBA TBA 65 80, 85 AAROM AROM 105 AAROM 110 AROM 122  Knee extension TBA TBA 0   0 0  Ankle dorsiflexion         Ankle plantarflexion         Ankle inversion         Ankle eversion          (Blank rows = not tested)  LOWER EXTREMITY MMT:  MMT Right eval Left eval Right 06/13/24  Hip flexion TBA 3+ 4  Hip extension     Hip abduction     Hip adduction     Hip internal rotation     Hip external rotation     Knee flexion TBA 4- 4+  Knee extension TBA 4- 4+  Ankle dorsiflexion 4 4 4+  Ankle plantarflexion     Ankle inversion     Ankle eversion      (Blank rows = not  tested)    FUNCTIONAL TESTS:  5 times sit to stand: 37.63 seconds 2 minute walk test: 215 feet, RW and with knee brace  05/24/24:  :  335 feet, RW and knee brace 5TSTS: 21.81 seconds  GAIT: Distance walked: 75 feet in and out from treatment area Assistive device utilized: Environmental consultant - 2 wheeled Level of assistance: Modified independence Comments: Pt demonstrates antalgic gait pattern, could be influenced by 0 degree locking of immobilizing brace on RLE. Decreased stride length and velocity noted.  TREATMENT DATE:  06/13/24 Standing  Heel toe raises 2 x 10 Right knee drives for flexion x 2' Bike seat 12 x 5' Progress note 5 times sit to stand 15.86 sec using hands to assist AROM and MMT's see above LEFS 65/80 81.3%  06/05/24 Gait w/o use of AD x 250'  Bike seat 12 full rev 6 minutes Stair stretch x 15 repetitions Stair engagement with up with R LE then Heel raises on incline 20X Slant board 3x 20 Mini squats to chair for target  2 x 10 Balance tandem stance (2 x 30s each way) AROM right knee flexion in seated 105, AAROM 107  06/01/24 Heel raises on incline 20X Slant board 3x 20 Mini squats to chair for target  2 x 10 12 step knee drives for flexion 10X10 holds Rt leading on 4 step lunges in good form, no UE assist 2X10 Bike seat  13 full rev 6 minutes AROM right knee flexion in supine 105, AAROM 110  05/29/24 Bike seat 12 x 8' half revolutions Heel raises 2 x 10 Slant board 5 x 20 Mini squats to chair for target  2 x 10 12 step knee drives for flexion x 2' AROM right knee flexion in supine 105 degrees today Ended with bike seat 13 able to make full backwards revolutions x 10  PATIENT EDUCATION:  Education details: Pt was educated on findings of PT evaluation, prognosis, frequency of therapy visits and rationale, attendance policy,  and HEP if given.   Person educated: Patient and Spouse Education method: Explanation, Verbal cues, and Handouts Education comprehension: verbalized understanding, tactile cues required, and needs further education  HOME EXERCISE PROGRAM:   Access Code: [email or web address] URL: [email or web address] Date: 05/16/2024 Prepared by: Lang Ada  Exercises - Standing Knee Flexion AROM with Chair Support  - 1 x daily - 7 x weekly - 3 sets - 10 reps - Seated Knee Flexion Stretch  - 1 x daily - 7 x weekly - 3 sets - 10 reps - Seated Knee Flexion AAROM  - 1 x daily - 7 x weekly - 3 sets - 10 reps - Seated Knee Extension AAROM  - 1 x daily - 7 x weekly - 3 sets - 10 reps - Supine Bridge  - 1 x daily - 7 x weekly - 3 sets - 10 reps - Standing March with Counter Support  - 1 x daily - 7 x weekly - 3 sets - 10 reps - Sit to Stand with Armchair  - 1 x daily - 7 x weekly - 3 sets - 10 reps  ASSESSMENT:  CLINICAL IMPRESSION: S/p 10 weeks today.  Started with warm up today and did a progress note as patient feels she is back to her baseline.  Patient has met all set rehab goals and is agreeable to discharge at this time.  Updated HEP.    Eval:Patient is a 73 y.o. female who was seen today for physical therapy evaluation and treatment for S82.031A (ICD-10-CM) - Displaced transverse fracture of right patella, initial encounter for closed fracture.   Patient demonstrates decreased RLE strength, abnormal pain rating, impaired functional mobility, difficulty with ambulation and impaired balance. Patient demonstrates difficulty with ambulation during today's session with heavy dependence on UEs, decreased stride length and velocity noted. Patient also demonstrates difficulty getting out of the car and requires assist of therapist, pt able to walk into clinic with modified independence, RW. Patient requires education on role of PT, plan of care, HEP and  fall risk. Pt was not taken out of knee immobilizing  brace due to lack of protocol provided and HEP was prescribed on a pain free basis. Patient would benefit from skilled physical therapy for increased endurance with ambulation, increased RLE strength, improved functional mobility and improved balance for improved gait quality, return to higher level of function with ADLs, return to driving, and progress towards therapy goals.   OBJECTIVE IMPAIRMENTS: Abnormal gait, decreased activity tolerance, decreased balance, decreased endurance, decreased knowledge of use of DME, decreased mobility, difficulty walking, decreased ROM, decreased strength, increased edema, and pain.   ACTIVITY LIMITATIONS: carrying, lifting, bending, sitting, standing, squatting, stairs, transfers, bed mobility, bathing, toileting, locomotion level, and driving  PARTICIPATION LIMITATIONS: meal prep, cleaning, laundry, driving, shopping, community activity, occupation, and church  PERSONAL FACTORS: Age, Fitness, Past/current experiences, Time since onset of injury/illness/exacerbation, and 1 comorbidity: history of falling are also affecting patient's functional outcome.   REHAB POTENTIAL: Good  CLINICAL DECISION MAKING: Stable/uncomplicated  EVALUATION COMPLEXITY: Low   GOALS: Goals reviewed with patient? No  SHORT TERM GOALS: Target date: 05/03/24  Patient will demonstrate evidence of independence with individualized HEP and will report compliance for at least 3 days per week for optimized progression towards remaining therapy goals. Baseline:  Goal status: MET  2.  Patient will report a decrease in pain level during community ambulation by at least 2 points for improved quality of life. Baseline: 3/10 Goal status: MET     LONG TERM GOALS: Target date: 05/24/24  Pt will demonstrate a an increase of at least 9 points on the LEFS for improved performance of community ambulation and ADL. Baseline: see obective Goal status: MET  2.  Pt will improve 2 MWT by 140  in order to demonstrate improved functional ambulatory capacity in community setting.  Baseline: 133 ft with RW Goal status: MET  3.  Pt will demonstrate WFL ROM (flexion and extension) in right knee, for increased mobility and maximal efficiency of gait cycle during ambulation. Baseline: protocol, pt locked at 0 degrees upon presenting to evaluation Goal status: MET  4.  Pt will demonstrate at least 4/5 MMT for right lower extremity for increased strength during ADL and community ambulation. Baseline: see objective Goal status: MET  5.  Pt will improve 5TSTS by 2.3 seconds in order to improve functional strength during functional transfers. Baseline: see objective Goal status: MET    PLAN:  PT FREQUENCY: 2x/week  PT DURATION: 6 weeks  PLANNED INTERVENTIONS: 97110-Therapeutic exercises, 97530- Therapeutic activity, 97112- Neuromuscular re-education, 97535- Self Care, [id number]- Manual therapy, [phone]- Gait training, Patient/Family education, Balance training, Stair training, Joint mobilization, Scar mobilization, DME instructions, Cryotherapy, and Moist heat  PLAN FOR NEXT SESSION: discharge  10:07 AM, 06/13/24 Kaelum Kissick Small Chantia Amalfitano MPT [facility] [place] [phone] Ph:[phone]

## 2024-06-21 ENCOUNTER — Encounter (HOSPITAL_COMMUNITY)

## 2024-06-28 ENCOUNTER — Encounter (HOSPITAL_COMMUNITY)

## 2024-06-30 ENCOUNTER — Encounter (HOSPITAL_COMMUNITY)

## 2024-07-07 ENCOUNTER — Ambulatory Visit: Admission: EM | Admit: 2024-07-07 | Discharge: 2024-07-07 | Disposition: A | Discharge status: Home / Self Care

## 2024-07-07 DIAGNOSIS — R21 Rash and other nonspecific skin eruption: Secondary | ICD-10-CM | POA: Diagnosis not present

## 2024-07-07 MED ORDER — DEXAMETHASONE SODIUM PHOSPHATE 10 MG/ML IJ SOLN
10.0000 mg | INTRAMUSCULAR | Status: AC
  Administered 2024-07-07: 10 mg via INTRAMUSCULAR

## 2024-07-07 MED ORDER — PERMETHRIN 5 % EX CREA: TOPICAL_CREAM | CUTANEOUS | 1 refills | Status: DC

## 2024-07-07 MED ORDER — PREDNISONE 20 MG PO TABS: 20.0000 mg | ORAL_TABLET | Freq: Every day | ORAL | 0 refills | Status: AC

## 2024-07-07 NOTE — ED Triage Notes (Signed)
 Patient presents to the office for a rash all over her body x 1 month. Patient states she saw dermatology and was prescribed a steroid cream. Patient has not switch detergent or soaps.

## 2024-07-07 NOTE — ED Provider Notes (Signed)
 [facility]    CSN: [id number] Arrival date & time: 07/07/24  1512      History   Chief Complaint Chief Complaint  Patient presents with   Rash    HPI Gabriella Holmes is a 73 y.o. female.   The history is provided by the patient.   Patient presents for complaints of rash has been present for the past month.  Patient states that the rash is itchy and worsens at night.  Patient states the rash is all over her body.  Patient denies exposure to new soaps, medications, lotions, foods, or detergents.  She states that she has a new grand dog but does not see the pet that often.  She states that she did see dermatology and was told that they are bedbug bites, but patient disagrees.  Patient was prescribed betamethasone cream for her symptoms.  Patient states she is concerned that she may have scabies.  Past Medical History:  Diagnosis Date   GERD (gastroesophageal reflux disease)    High cholesterol    Hypertension    Type II diabetes mellitus (HCC)     Patient Active Problem List   Diagnosis Date Noted   Diabetic polyneuropathy (HCC) 01/26/2017   Hypercholesterolemia 01/26/2017   Hypertension, essential 01/26/2017   Type 2 diabetes mellitus with hyperglycemia, with long-term current use of insulin  (HCC) 01/26/2017   Vitamin B12 deficiency 01/26/2017   Vitamin D insufficiency 01/26/2017   Fracture of proximal humerus 09/07/2013   Humerus fracture 09/04/2013   Vitreous hemorrhage, right (HCC) 03/02/2013   Posterior vitreous detachment 06/27/2012   Pseudophakia 06/27/2012   Nuclear sclerotic cataract of both eyes 08/06/2011   Proliferative diabetic retinopathy of both eyes without macular edema associated with type 2 diabetes mellitus (HCC) 08/06/2011    Past Surgical History:  Procedure Laterality Date   BACK SURGERY     CARDIAC CATHETERIZATION  2001   CATARACT EXTRACTION W/ INTRAOCULAR LENS  IMPLANT, BILATERAL Bilateral ~ 2011   CHOLECYSTECTOMY  ~ 2012    COLONOSCOPY N/A 01/09/2014   Procedure: COLONOSCOPY;  Surgeon: Lamar CHRISTELLA Hollingshead, MD;  [facility]: [facility];  Service: Endoscopy;  Laterality: N/A;  7:30 AM-rescheduled to 845 Doris notified pt   CYST EXCISION  1980's   from facial area.    EYE SURGERY     LUMBAR DISC SURGERY  1989   ORIF HUMERUS FRACTURE Right 09/21/2013   Procedure: OPEN REDUCTION INTERNAL FIXATION (ORIF) PROXIMAL HUMERUS FRACTURE;  Surgeon: Eva Elsie Herring, MD;  Location[facility] OR;  Service: Orthopedics;  Laterality: Right;  Right open reduction internal fixation humerus fracture   ORIF PROXIMAL HUMERUS FRACTURE Right 09/21/2013   PHOTOCOAGULATION WITH LASER Bilateral ~ 2011-2014   for diabetic retinopathy (09/21/2013)   TRIGGER FINGER RELEASE Left 1980's   TUBAL LIGATION  1975    OB History   No obstetric history on file.      Home Medications    Prior to Admission medications   Medication Sig Start Date End Date Taking? Authorizing Provider  aspirin  EC 81 MG tablet Take 81 mg by mouth daily.   Yes [provider]  atorvastatin  (LIPITOR) 80 MG tablet Take 80 mg by mouth at bedtime.   Yes [provider]  Calcium  Carbonate-Vitamin D (CALTRATE 600+D PO) Take 1 tablet by mouth daily.    Yes [provider]  chlorthalidone  (HYGROTON ) 25 MG tablet Take 25 mg by mouth every other day.   Yes [provider]  cyanocobalamin  (,VITAMIN B-12,)  1000 MCG/ML injection Inject 1,000 mcg into the muscle every 30 (thirty) days. First of each month   Yes [provider]  empagliflozin (JARDIANCE) 25 MG TABS tablet Jardiance 25 mg tablet 02/13/19  Yes [provider]  Insulin  Degludec (TRESIBA) 100 UNIT/ML SOLN Inject into the skin.   Yes [provider]  losartan  (COZAAR ) 100 MG tablet Take 100 mg by mouth daily.   Yes [provider]  metFORMIN  (GLUCOPHAGE ) 1000 MG tablet Take 1,000 mg by mouth 2 (two) times daily with a meal.   Yes [provider]   Multiple Vitamin (MULTIVITAMIN) tablet Take 1 tablet by mouth daily.   Yes [provider]  omeprazole (PRILOSEC) 20 MG capsule TK 1 C PO QD 08/03/19  Yes [provider]  permethrin  (ELIMITE ) 5 % cream Apply to the entire body, wash off after 8 to 14 hours.  Repeat in 1 week. 07/07/24  Yes Leath-Warren, Etta PARAS, NP  pioglitazone  (ACTOS ) 30 MG tablet Take 15 mg by mouth daily.   Yes [provider]  predniSONE  (DELTASONE ) 20 MG tablet Take 1 tablet (20 mg total) by mouth daily with breakfast for 7 days. 07/07/24 07/14/24 Yes Leath-Warren, Etta PARAS, NP  augmented betamethasone dipropionate (DIPROLENE-AF) 0.05 % cream Apply topically.    [provider]  Canagliflozin  (INVOKANA ) 300 MG TABS Take 300 mg by mouth daily.    [provider]  Dulaglutide (TRULICITY) 1.5 MG/0.5ML SOPN Trulicity 1.5 mg/0.5 mL subcutaneous pen injector 08/23/18   [provider]  hydrochlorothiazide (HYDRODIURIL) 25 MG tablet TK 1 T PO D 08/03/19   [provider]    Family History History reviewed. No pertinent family history.  Social History Social History   Tobacco Use   Smoking status: Former    Current packs/day: 0.50    Average packs/day: 0.5 packs/day for 5.0 years (2.5 ttl pk-yrs)    Types: Cigarettes   Smokeless tobacco: Never   Tobacco comments:    09/21/2013 quit smoking cigarettes when I was probably 73 yr old  Substance Use Topics   Alcohol use: Yes    Comment: 09/21/2013 might have alcohol 1-2X/yr   Drug use: No     Allergies   Patient has no known allergies.   Review of Systems Review of Systems Per HPI  Physical Exam Triage Vital Signs ED Triage Vitals  Encounter Vitals Group     BP 07/07/24 1638 (!) 148/79     Girls Systolic BP Percentile --      Girls Diastolic BP Percentile --      Boys Systolic BP Percentile --      Boys Diastolic BP Percentile --      Pulse Rate 07/07/24 1638 82     Resp 07/07/24 1638 18      Temp --      Temp src --      SpO2 07/07/24 1638 96 %     Weight --      Height --      Head Circumference --      Peak Flow --      Pain Score 07/07/24 1643 0     Pain Loc --      Pain Education --      Exclude from Growth Chart --    No data found.  Updated Vital Signs BP (!) 148/79 (BP Location: Left Arm)   Pulse 82   Resp 18   SpO2 96%   Visual Acuity Right Eye Distance:  Left Eye Distance:   Bilateral Distance:    Right Eye Near:   Left Eye Near:    Bilateral Near:     Physical Exam Vitals and nursing note reviewed.  Constitutional:      General: She is not in acute distress.    Appearance: Normal appearance.  HENT:     Head: Normocephalic.  Eyes:     Extraocular Movements: Extraocular movements intact.     Pupils: Pupils are equal, round, and reactive to light.  Pulmonary:     Effort: Pulmonary effort is normal.  Musculoskeletal:     Cervical back: Normal range of motion.  Skin:    General: Skin is warm and dry.     Findings: Rash present. Rash is macular and papular.     Comments: Patient with clusters of maculopapular rash noted to generalized body surface area.  The areas are erythematous.  The rash is in no congruent pattern.  There is no oozing, fluctuance, or drainage present.  Neurological:     General: No focal deficit present.     Mental Status: She is alert and oriented to person, place, and time.  Psychiatric:        Mood and Affect: Mood normal.        Behavior: Behavior normal.      UC Treatments / Results  Labs (all labs ordered are listed, but only abnormal results are displayed) Labs Reviewed - No data to display  EKG   Radiology No results found.  Procedures Procedures (including critical care time)  Medications Ordered in UC Medications  dexamethasone  (DECADRON ) injection 10 mg (has no administration in time range)    Initial Impression / Assessment and Plan / UC Course  I have reviewed the triage vital signs  and the nursing notes.  Pertinent labs & imaging results that were available during my care of the patient were reviewed by me and considered in my medical decision making (see chart for details).  Decadron  10 mg IM administered for itching.  Will start patient on prednisone  20 mg for the next 7 days.  For patient's concern for scabies, will treat with permethrin .  Supportive care recommendations were provided and discussed with the patient to include continuing use of the cream prescribed by her dermatologist, keeping the skin clean and dry, over-the-counter antihistamines, and use of Aveeno colloidal oatmeal bath.  Patient was advised that if symptoms fail to improve with this treatment, recommend follow-up with dermatologist for further evaluation.  Patient was in agreement with this plan of care and verbalizes understanding.  All questions were answered.  Patient stable for discharge.   Final Clinical Impressions(s) / UC Diagnoses   Final diagnoses:  Rash and nonspecific skin eruption     Discharge Instructions      You were given an injection of Decadron  10 mg today.  Start the prednisone  tomorrow.  As discussed, monitor your blood glucose levels slowly while taking the prednisone .  If your blood sugars exceed 350, discontinue the medication. Take medication as prescribed. You may take over-the-counter Zyrtec, Claritin, or Allegra during the daytime, and Benadryl  at bedtime as needed for itching. Avoid hot baths or showers.  Recommend lukewarm baths or showers while symptoms persist. Avoid scratching or manipulating the areas. You may apply cool compresses to the affected areas to help with itching or discomfort. Also recommend over-the-counter Aveeno colloidal oatmeal bath to help with itching and drying of the rash. If symptoms fail to improve with this treatment, please  follow-up with your primary care physician or with dermatology for reevaluation. Follow-up as needed.     ED  Prescriptions     Medication Sig Dispense Auth. Provider   permethrin  (ELIMITE ) 5 % cream Apply to the entire body, wash off after 8 to 14 hours.  Repeat in 1 week. 60 g Leath-Warren, Etta PARAS, NP   predniSONE  (DELTASONE ) 20 MG tablet Take 1 tablet (20 mg total) by mouth daily with breakfast for 7 days. 7 tablet Leath-Warren, Etta PARAS, NP      PDMP not reviewed this encounter.   Gilmer Etta PARAS, NP 07/07/24 1721

## 2024-07-07 NOTE — Discharge Instructions (Signed)
 You were given an injection of Decadron  10 mg today.  Start the prednisone  tomorrow.  As discussed, monitor your blood glucose levels slowly while taking the prednisone .  If your blood sugars exceed 350, discontinue the medication. Take medication as prescribed. You may take over-the-counter Zyrtec, Claritin, or Allegra during the daytime, and Benadryl  at bedtime as needed for itching. Avoid hot baths or showers.  Recommend lukewarm baths or showers while symptoms persist. Avoid scratching or manipulating the areas. You may apply cool compresses to the affected areas to help with itching or discomfort. Also recommend over-the-counter Aveeno colloidal oatmeal bath to help with itching and drying of the rash. If symptoms fail to improve with this treatment, please follow-up with your primary care physician or with dermatology for reevaluation. Follow-up as needed.

## 2024-08-16 LAB — COLOGUARD: COLOGUARD: POSITIVE — AB

## 2024-09-04 ENCOUNTER — Encounter: Payer: Self-pay | Admitting: Internal Medicine

## 2024-09-05 ENCOUNTER — Encounter: Payer: Self-pay | Admitting: Internal Medicine

## 2024-09-06 ENCOUNTER — Ambulatory Visit: Admitting: Internal Medicine

## 2024-10-11 ENCOUNTER — Encounter: Payer: Self-pay | Admitting: Internal Medicine

## 2024-10-11 ENCOUNTER — Ambulatory Visit: Admitting: Internal Medicine

## 2024-10-11 ENCOUNTER — Telehealth (INDEPENDENT_AMBULATORY_CARE_PROVIDER_SITE_OTHER): Payer: Self-pay

## 2024-10-11 VITALS — BP 132/59 | HR 79 | Temp 97.3°F | Ht 68.0 in | Wt 197.4 lb

## 2024-10-11 DIAGNOSIS — Z1211 Encounter for screening for malignant neoplasm of colon: Secondary | ICD-10-CM

## 2024-10-11 DIAGNOSIS — K219 Gastro-esophageal reflux disease without esophagitis: Secondary | ICD-10-CM | POA: Diagnosis not present

## 2024-10-11 DIAGNOSIS — R195 Other fecal abnormalities: Secondary | ICD-10-CM

## 2024-10-11 MED ORDER — NA SULFATE-K SULFATE-MG SULF 17.5-3.13-1.6 GM/177ML PO SOLN: ORAL | 0 refills | Status: DC

## 2024-10-11 MED ORDER — PEG 3350-KCL-NA BICARB-NACL 420 G PO SOLR: 4000.0000 mL | Freq: Once | ORAL | 0 refills | Status: DC

## 2024-10-11 NOTE — Telephone Encounter (Signed)
 PA for TCS: Prior Auth not required.

## 2024-10-11 NOTE — Progress Notes (Signed)
 Primary Care Physician:  Sheryle Carwin, MD Primary Gastroenterologist:  Dr. Cindie  Chief Complaint  Patient presents with   New Patient (Initial Visit)    Patient here today due to having a recent positive cologuard. Patient denies any current gi related issues.     HPI:   Gabriella Holmes is a 73 y.o. female who presents to clinic today by referral from her PCP Dr. Sheryle for evaluation.  Positive Cologuard testing: Recent positive Cologuard test.  Last colonoscopy 01/09/2014 normal.  Denies any family history of colorectal malignancy.  No melena hematochezia.  No abdominal pain or unintentional weight loss.  Chronic GERD: Well-controlled on omeprazole 20 mg daily.  Denies any dysphagia or odynophagia.  No epigastric or chest pain.  Past Medical History:  Diagnosis Date   GERD (gastroesophageal reflux disease)    High cholesterol    Hypertension    Type II diabetes mellitus (HCC)     Past Surgical History:  Procedure Laterality Date   BACK SURGERY     CARDIAC CATHETERIZATION  2001   CATARACT EXTRACTION W/ INTRAOCULAR LENS  IMPLANT, BILATERAL Bilateral ~ 2011   CHOLECYSTECTOMY  ~ 2012   COLONOSCOPY N/A 01/09/2014   Procedure: COLONOSCOPY;  Surgeon: Lamar CHRISTELLA Hollingshead, MD;  [facility];  Service: Endoscopy;  Laterality: N/A;  7:30 AM-rescheduled to 845 [address] notified pt   CYST EXCISION  1980's   from facial area.    EYE SURGERY     LUMBAR DISC SURGERY  1989   ORIF HUMERUS FRACTURE Right 09/21/2013   Procedure: OPEN REDUCTION INTERNAL FIXATION (ORIF) PROXIMAL HUMERUS FRACTURE;  Surgeon: Eva Elsie Herring, MD;  Location: [facility] OR;  Service: Orthopedics;  Laterality: Right;  Right open reduction internal fixation humerus fracture   ORIF PROXIMAL HUMERUS FRACTURE Right 09/21/2013   PHOTOCOAGULATION WITH LASER Bilateral ~ 2011-2014   for diabetic retinopathy (09/21/2013)   TRIGGER FINGER RELEASE Left 1980's   TUBAL LIGATION  1975    Current Outpatient Medications   Medication Sig Dispense Refill   aspirin  EC 81 MG tablet Take 81 mg by mouth daily.     atorvastatin  (LIPITOR) 80 MG tablet Take 80 mg by mouth at bedtime.     Calcium  Carbonate-Vitamin D (CALTRATE 600+D PO) Take 1 tablet by mouth daily.      chlorthalidone  (HYGROTON ) 25 MG tablet Take 25 mg by mouth every other day.     cyanocobalamin  (,VITAMIN B-12,) 1000 MCG/ML injection Inject 1,000 mcg into the muscle every 30 (thirty) days. First of each month     empagliflozin (JARDIANCE) 25 MG TABS tablet Jardiance 25 mg tablet (Patient taking differently: Take 25 mg by mouth daily.)     hydrochlorothiazide (HYDRODIURIL) 25 MG tablet TK 1 T PO D (Patient taking differently: Take 25 mg by mouth. Every other day.)     Insulin  Degludec (TRESIBA) 100 UNIT/ML SOLN Inject into the skin. (Patient taking differently: Inject 28 Units into the skin daily at 6 (six) AM.)     losartan  (COZAAR ) 100 MG tablet Take 100 mg by mouth daily.     Multiple Vitamin (MULTIVITAMIN) tablet Take 1 tablet by mouth daily.     omeprazole (PRILOSEC) 20 MG capsule TK 1 C PO QD (Patient taking differently: Take 20 mg by mouth daily.)     pioglitazone  (ACTOS ) 30 MG tablet Take 15 mg by mouth daily.     augmented betamethasone dipropionate (DIPROLENE-AF) 0.05 % cream Apply topically.     MOUNJARO 10 MG/0.5ML  Pen Inject 10 mg into the skin once a week.     No current facility-administered medications for this visit.    Allergies as of 10/11/2024 - Review Complete 10/11/2024  Allergen Reaction Noted   Ibandronate Other (See Comments) 12/01/2019    History reviewed. No pertinent family history.  Social History   Socioeconomic History   Marital status: Married    Spouse name: Not on file   Number of children: Not on file   Years of education: Not on file   Highest education level: Not on file  Occupational History   Not on file  Tobacco Use   Smoking status: Former    Current packs/day: 0.50    Average packs/day: 0.5  packs/day for 5.0 years (2.5 ttl pk-yrs)    Types: Cigarettes   Smokeless tobacco: Never   Tobacco comments:    09/21/2013 quit smoking cigarettes when I was probably 73 yr old  Vaping Use   Vaping status: Never Used  Substance and Sexual Activity   Alcohol use: Yes    Comment: 09/21/2013 might have alcohol 1-2X/yr   Drug use: No   Sexual activity: Yes  Other Topics Concern   Not on file  Social History Narrative   ** Merged History Encounter **       Social Drivers of Health   Financial Resource Strain: Low Risk  (03/27/2024)   Received from [facility]   Overall Financial Resource Strain (CARDIA)    Difficulty of Paying Living Expenses: Not hard at all  Food Insecurity: No Food Insecurity (03/27/2024)   Received from [facility]   Hunger Vital Sign    Within the past 12 months, you worried that your food would run out before you got the money to buy more.: Never true    Within the past 12 months, the food you bought just didn't last and you didn't have money to get more.: Never true  Transportation Needs: No Transportation Needs (03/27/2024)   Received from [facility] - Transportation    In the past 12 months, has lack of transportation kept you from medical appointments or from getting medications?: No    Lack of Transportation (Non-Medical): No  Physical Activity: Not on file  Stress: Not on file  Social Connections: Not on file  Intimate Partner Violence: Not on file    Subjective: Review of Systems  Constitutional:  Negative for chills and fever.  HENT:  Negative for congestion and hearing loss.   Eyes:  Negative for blurred vision and double vision.  Respiratory:  Negative for cough and shortness of breath.   Cardiovascular:  Negative for chest pain and palpitations.  Gastrointestinal:  Negative for abdominal pain, blood in stool, constipation, diarrhea, heartburn, melena and vomiting.   Genitourinary:  Negative for dysuria and urgency.  Musculoskeletal:  Negative for joint pain and myalgias.  Skin:  Negative for itching and rash.  Neurological:  Negative for dizziness and headaches.  Psychiatric/Behavioral:  Negative for depression. The patient is not nervous/anxious.        Objective: BP (!) 132/59 (BP Location: Right Arm, Patient Position: Sitting, Cuff Size: Large)   Pulse 79   Temp (!) 97.3 F (36.3 C) (Temporal)   Ht 5' 8 (1.727 m)   Wt 197 lb 6.4 oz (89.5 kg)   BMI 30.01 kg/m  Physical Exam Constitutional:      Appearance: Normal appearance.  HENT:     Head: Normocephalic  and atraumatic.  Eyes:     Extraocular Movements: Extraocular movements intact.     Conjunctiva/sclera: Conjunctivae normal.  Cardiovascular:     Rate and Rhythm: Normal rate and regular rhythm.  Pulmonary:     Effort: Pulmonary effort is normal.     Breath sounds: Normal breath sounds.  Abdominal:     General: Bowel sounds are normal.     Palpations: Abdomen is soft.  Musculoskeletal:        General: No swelling. Normal range of motion.     Cervical back: Normal range of motion and neck supple.  Skin:    General: Skin is warm and dry.     Coloration: Skin is not jaundiced.  Neurological:     General: No focal deficit present.     Mental Status: She is alert and oriented to person, place, and time.  Psychiatric:        Mood and Affect: Mood normal.        Behavior: Behavior normal.      Assessment/Plan:  1.  Positive Cologuard testing- Will schedule for screening colonoscopy.The risks including infection, bleed, or perforation as well as benefits, limitations, alternatives and imponderables have been reviewed with the patient. Questions have been answered. All parties agreeable.  Patient will need to hold her Mounjaro Sunday before her procedure.  2.  Chronic GERD-mild, well-controlled on omeprazole daily.  Will continue.  Thank you Dr. Sheryle for the kind  referral.   10/11/2024 11:08 AM

## 2024-10-11 NOTE — Telephone Encounter (Signed)
 Spoke with patient in the office, scheduled TCS for 10/24/2024 at 9:00 am. Rx sent to pharmacy. Instructions given to patient in the office.   Added to instructions: NO Tresiba the day of your procedure.

## 2024-10-11 NOTE — Patient Instructions (Signed)
 We will schedule you for colonoscopy given your recent positive Cologuard testing.  You will need to hold your Mounjaro dose Sunday before your procedure.  It was very nice meeting you today.  Dr. Cindie

## 2024-10-11 NOTE — H&P (View-Only) (Signed)
 Primary Care Physician:  Sheryle Carwin, MD Primary Gastroenterologist:  Dr. Cindie  Chief Complaint  Patient presents with   New Patient (Initial Visit)    Patient here today due to having a recent positive cologuard. Patient denies any current gi related issues.     HPI:   Gabriella Holmes is a 73 y.o. female who presents to clinic today by referral from her PCP Dr. Sheryle for evaluation.  Positive Cologuard testing: Recent positive Cologuard test.  Last colonoscopy 01/09/2014 normal.  Denies any family history of colorectal malignancy.  No melena hematochezia.  No abdominal pain or unintentional weight loss.  Chronic GERD: Well-controlled on omeprazole 20 mg daily.  Denies any dysphagia or odynophagia.  No epigastric or chest pain.  Past Medical History:  Diagnosis Date   GERD (gastroesophageal reflux disease)    High cholesterol    Hypertension    Type II diabetes mellitus (HCC)     Past Surgical History:  Procedure Laterality Date   BACK SURGERY     CARDIAC CATHETERIZATION  2001   CATARACT EXTRACTION W/ INTRAOCULAR LENS  IMPLANT, BILATERAL Bilateral ~ 2011   CHOLECYSTECTOMY  ~ 2012   COLONOSCOPY N/A 01/09/2014   Procedure: COLONOSCOPY;  Surgeon: Lamar CHRISTELLA Hollingshead, MD;  [facility]: [facility];  Service: Endoscopy;  Laterality: N/A;  7:30 AM-rescheduled to [address]45 [address] notified pt   CYST EXCISION  1980's   from facial area.    EYE SURGERY     LUMBAR DISC SURGERY  1989   ORIF HUMERUS FRACTURE Right 09/21/2013   Procedure: OPEN REDUCTION INTERNAL FIXATION (ORIF) PROXIMAL HUMERUS FRACTURE;  Surgeon: Eva Elsie Herring, MD;  Location: [facility] OR;  Service: Orthopedics;  Laterality: Right;  Right open reduction internal fixation humerus fracture   ORIF PROXIMAL HUMERUS FRACTURE Right 09/21/2013   PHOTOCOAGULATION WITH LASER Bilateral ~ 2011-2014   for diabetic retinopathy (09/21/2013)   TRIGGER FINGER RELEASE Left 1980's   TUBAL LIGATION  1975    Current Outpatient Medications   Medication Sig Dispense Refill   aspirin  EC 81 MG tablet Take 81 mg by mouth daily.     atorvastatin  (LIPITOR) 80 MG tablet Take 80 mg by mouth at bedtime.     Calcium  Carbonate-Vitamin D (CALTRATE 600+D PO) Take 1 tablet by mouth daily.      chlorthalidone  (HYGROTON ) 25 MG tablet Take 25 mg by mouth every other day.     cyanocobalamin  (,VITAMIN B-12,) 1000 MCG/ML injection Inject 1,000 mcg into the muscle every 30 (thirty) days. First of each month     empagliflozin (JARDIANCE) 25 MG TABS tablet Jardiance 25 mg tablet (Patient taking differently: Take 25 mg by mouth daily.)     hydrochlorothiazide (HYDRODIURIL) 25 MG tablet TK 1 T PO D (Patient taking differently: Take 25 mg by mouth. Every other day.)     Insulin  Degludec (TRESIBA) 100 UNIT/ML SOLN Inject into the skin. (Patient taking differently: Inject 28 Units into the skin daily at 6 (six) AM.)     losartan  (COZAAR ) 100 MG tablet Take 100 mg by mouth daily.     Multiple Vitamin (MULTIVITAMIN) tablet Take 1 tablet by mouth daily.     omeprazole (PRILOSEC) 20 MG capsule TK 1 C PO QD (Patient taking differently: Take 20 mg by mouth daily.)     pioglitazone  (ACTOS ) 30 MG tablet Take 15 mg by mouth daily.     augmented betamethasone dipropionate (DIPROLENE-AF) 0.05 % cream Apply topically.     MOUNJARO 10 MG/0.5ML  Pen Inject 10 mg into the skin once a week.     No current facility-administered medications for this visit.    Allergies as of 10/11/2024 - Review Complete 10/11/2024  Allergen Reaction Noted   Ibandronate Other (See Comments) 12/01/2019    History reviewed. No pertinent family history.  Social History   Socioeconomic History   Marital status: Married    Spouse name: Not on file   Number of children: Not on file   Years of education: Not on file   Highest education level: Not on file  Occupational History   Not on file  Tobacco Use   Smoking status: Former    Current packs/day: 0.50    Average packs/day: 0.5  packs/day for 5.0 years (2.5 ttl pk-yrs)    Types: Cigarettes   Smokeless tobacco: Never   Tobacco comments:    09/21/2013 quit smoking cigarettes when I was probably 73 yr old  Vaping Use   Vaping status: Never Used  Substance and Sexual Activity   Alcohol use: Yes    Comment: 09/21/2013 might have alcohol 1-2X/yr   Drug use: No   Sexual activity: Yes  Other Topics Concern   Not on file  Social History Narrative   ** Merged History Encounter **       Social Drivers of Health   Financial Resource Strain: Low Risk  (03/27/2024)   Received from [facility]   Overall Financial Resource Strain (CARDIA)    Difficulty of Paying Living Expenses: Not hard at all  Food Insecurity: No Food Insecurity (03/27/2024)   Received from [facility]   Hunger Vital Sign    Within the past 12 months, you worried that your food would run out before you got the money to buy more.: Never true    Within the past 12 months, the food you bought just didn't last and you didn't have money to get more.: Never true  Transportation Needs: No Transportation Needs (03/27/2024)   Received from [facility] Transportation    In the past 12 months, has lack of transportation kept you from medical appointments or from getting medications?: No    Lack of Transportation (Non-Medical): No  Physical Activity: Not on file  Stress: Not on file  Social Connections: Not on file  Intimate Partner Violence: Not on file    Subjective: Review of Systems  Constitutional:  Negative for chills and fever.  HENT:  Negative for congestion and hearing loss.   Eyes:  Negative for blurred vision and double vision.  Respiratory:  Negative for cough and shortness of breath.   Cardiovascular:  Negative for chest pain and palpitations.  Gastrointestinal:  Negative for abdominal pain, blood in stool, constipation, diarrhea, heartburn, melena and vomiting.   Genitourinary:  Negative for dysuria and urgency.  Musculoskeletal:  Negative for joint pain and myalgias.  Skin:  Negative for itching and rash.  Neurological:  Negative for dizziness and headaches.  Psychiatric/Behavioral:  Negative for depression. The patient is not nervous/anxious.        Objective: BP (!) 132/59 (BP Location: Right Arm, Patient Position: Sitting, Cuff Size: Large)   Pulse 79   Temp (!) 97.3 F (36.3 C) (Temporal)   Ht 5' 8 (1.727 m)   Wt 197 lb 6.4 oz (89.5 kg)   BMI 30.01 kg/m  Physical Exam Constitutional:      Appearance: Normal appearance.  HENT:     Head: Normocephalic  and atraumatic.  Eyes:     Extraocular Movements: Extraocular movements intact.     Conjunctiva/sclera: Conjunctivae normal.  Cardiovascular:     Rate and Rhythm: Normal rate and regular rhythm.  Pulmonary:     Effort: Pulmonary effort is normal.     Breath sounds: Normal breath sounds.  Abdominal:     General: Bowel sounds are normal.     Palpations: Abdomen is soft.  Musculoskeletal:        General: No swelling. Normal range of motion.     Cervical back: Normal range of motion and neck supple.  Skin:    General: Skin is warm and dry.     Coloration: Skin is not jaundiced.  Neurological:     General: No focal deficit present.     Mental Status: She is alert and oriented to person, place, and time.  Psychiatric:        Mood and Affect: Mood normal.        Behavior: Behavior normal.      Assessment/Plan:  1.  Positive Cologuard testing- Will schedule for screening colonoscopy.The risks including infection, bleed, or perforation as well as benefits, limitations, alternatives and imponderables have been reviewed with the patient. Questions have been answered. All parties agreeable.  Patient will need to hold her Mounjaro Sunday before her procedure.  2.  Chronic GERD-mild, well-controlled on omeprazole daily.  Will continue.  Thank you Dr. Sheryle for the kind  referral.   10/11/2024 11:08 AM

## 2024-10-19 ENCOUNTER — Inpatient Hospital Stay (HOSPITAL_COMMUNITY): Admission: RE | Admit: 2024-10-19 | Discharge: 2024-10-19 | Attending: Internal Medicine

## 2024-10-19 ENCOUNTER — Encounter (HOSPITAL_COMMUNITY): Payer: Self-pay

## 2024-10-19 ENCOUNTER — Other Ambulatory Visit: Payer: Self-pay

## 2024-10-24 ENCOUNTER — Ambulatory Visit (HOSPITAL_COMMUNITY)
Admission: RE | Admit: 2024-10-24 | Discharge: 2024-10-24 | Disposition: A | Source: Home / Self Care | Attending: Internal Medicine | Admitting: Internal Medicine

## 2024-10-24 ENCOUNTER — Ambulatory Visit (HOSPITAL_COMMUNITY): Admitting: Anesthesiology

## 2024-10-24 ENCOUNTER — Encounter (HOSPITAL_COMMUNITY): Admission: RE | Disposition: A | Payer: Self-pay | Source: Home / Self Care | Attending: Internal Medicine

## 2024-10-24 ENCOUNTER — Encounter (HOSPITAL_COMMUNITY): Payer: Self-pay | Admitting: Internal Medicine

## 2024-10-24 DIAGNOSIS — Z7984 Long term (current) use of oral hypoglycemic drugs: Secondary | ICD-10-CM | POA: Insufficient documentation

## 2024-10-24 DIAGNOSIS — K219 Gastro-esophageal reflux disease without esophagitis: Secondary | ICD-10-CM | POA: Diagnosis not present

## 2024-10-24 DIAGNOSIS — Z794 Long term (current) use of insulin: Secondary | ICD-10-CM | POA: Insufficient documentation

## 2024-10-24 DIAGNOSIS — K648 Other hemorrhoids: Secondary | ICD-10-CM | POA: Insufficient documentation

## 2024-10-24 DIAGNOSIS — D123 Benign neoplasm of transverse colon: Secondary | ICD-10-CM | POA: Insufficient documentation

## 2024-10-24 DIAGNOSIS — E119 Type 2 diabetes mellitus without complications: Secondary | ICD-10-CM | POA: Insufficient documentation

## 2024-10-24 DIAGNOSIS — R195 Other fecal abnormalities: Secondary | ICD-10-CM | POA: Diagnosis not present

## 2024-10-24 DIAGNOSIS — K635 Polyp of colon: Secondary | ICD-10-CM | POA: Diagnosis not present

## 2024-10-24 DIAGNOSIS — I10 Essential (primary) hypertension: Secondary | ICD-10-CM | POA: Insufficient documentation

## 2024-10-24 DIAGNOSIS — Z7985 Long-term (current) use of injectable non-insulin antidiabetic drugs: Secondary | ICD-10-CM | POA: Insufficient documentation

## 2024-10-24 DIAGNOSIS — Z1211 Encounter for screening for malignant neoplasm of colon: Secondary | ICD-10-CM | POA: Insufficient documentation

## 2024-10-24 DIAGNOSIS — Z79899 Other long term (current) drug therapy: Secondary | ICD-10-CM | POA: Insufficient documentation

## 2024-10-24 DIAGNOSIS — Z87891 Personal history of nicotine dependence: Secondary | ICD-10-CM | POA: Insufficient documentation

## 2024-10-24 HISTORY — PX: COLONOSCOPY: SHX5424

## 2024-10-24 HISTORY — PX: POLYPECTOMY: SHX149

## 2024-10-24 LAB — GLUCOSE, CAPILLARY: Glucose-Capillary: 124 mg/dL — ABNORMAL HIGH (ref 70–99)

## 2024-10-24 SURGERY — COLONOSCOPY
Anesthesia: Monitor Anesthesia Care

## 2024-10-24 MED ORDER — PROPOFOL 10 MG/ML IV BOLUS
INTRAVENOUS | Status: AC
  Filled 2024-10-24: qty 20

## 2024-10-24 MED ORDER — LACTATED RINGERS IV SOLN: INTRAVENOUS | Status: DC

## 2024-10-24 MED ORDER — EPHEDRINE SULFATE (PRESSORS) 25 MG/5ML IV SOSY
PREFILLED_SYRINGE | INTRAVENOUS | Status: DC | PRN
  Administered 2024-10-24: 10:00:00 5 mg via INTRAVENOUS

## 2024-10-24 MED ORDER — EPHEDRINE 5 MG/ML INJ
INTRAVENOUS | Status: AC
  Filled 2024-10-24: qty 5

## 2024-10-24 MED ORDER — PROPOFOL 10 MG/ML IV BOLUS
INTRAVENOUS | Status: DC | PRN
  Administered 2024-10-24: 09:00:00 100 mg via INTRAVENOUS
  Administered 2024-10-24: 10:00:00 50 mg via INTRAVENOUS
  Administered 2024-10-24: 09:00:00 100 mg via INTRAVENOUS
  Administered 2024-10-24: 10:00:00 50 mg via INTRAVENOUS

## 2024-10-24 MED ORDER — ONDANSETRON HCL 4 MG/2ML IJ SOLN
INTRAMUSCULAR | Status: DC | PRN
  Administered 2024-10-24: 09:00:00 4 mg via INTRAVENOUS

## 2024-10-24 NOTE — Interval H&P Note (Signed)
 History and Physical Interval Note:  10/24/2024 9:08 AM  Gabriella Holmes  has presented today for surgery, with the diagnosis of positive cologuard, screening colonoscopy.  The various methods of treatment have been discussed with the patient and family. After consideration of risks, benefits and other options for treatment, the patient has consented to  Procedures with comments: COLONOSCOPY (N/A) - 9:00am, ASA 3 as a surgical intervention.  The patient's history has been reviewed, patient examined, no change in status, stable for surgery.  I have reviewed the patient's chart and labs.  Questions were answered to the patient's satisfaction.     [name]

## 2024-10-24 NOTE — Discharge Instructions (Addendum)
°  Colonoscopy Discharge Instructions  Read the instructions outlined below and refer to this sheet in the next few weeks. These discharge instructions provide you with general information on caring for yourself after you leave the hospital. Your doctor may also give you specific instructions. While your treatment has been planned according to the most current medical practices available, unavoidable complications occasionally occur.   ACTIVITY You may resume your regular activity, but move at a slower pace for the next 24 hours.  Take frequent rest periods for the next 24 hours.  Walking will help get rid of the air and reduce the bloated feeling in your belly (abdomen).  No driving for 24 hours (because of the medicine (anesthesia) used during the test).   Do not sign any important legal documents or operate any machinery for 24 hours (because of the anesthesia used during the test).  NUTRITION Drink plenty of fluids.  You may resume your normal diet as instructed by your doctor.  Begin with a light meal and progress to your normal diet. Heavy or fried foods are harder to digest and may make you feel sick to your stomach (nauseated).  Avoid alcoholic beverages for 24 hours or as instructed.  MEDICATIONS You may resume your normal medications unless your doctor tells you otherwise.  WHAT YOU CAN EXPECT TODAY Some feelings of bloating in the abdomen.  Passage of more gas than usual.  Spotting of blood in your stool or on the toilet paper.  IF YOU HAD POLYPS REMOVED DURING THE COLONOSCOPY: No aspirin  products for 7 days or as instructed.  No alcohol for 7 days or as instructed.  Eat a soft diet for the next 24 hours.  FINDING OUT THE RESULTS OF YOUR TEST Not all test results are available during your visit. If your test results are not back during the visit, make an appointment with your caregiver to find out the results. Do not assume everything is normal if you have not heard from your  caregiver or the medical facility. It is important for you to follow up on all of your test results.  SEEK IMMEDIATE MEDICAL ATTENTION IF: You have more than a spotting of blood in your stool.  Your belly is swollen (abdominal distention).  You are nauseated or vomiting.  You have a temperature over 101.  You have abdominal pain or discomfort that is severe or gets worse throughout the day.   Your colonoscopy revealed 1 polyp(s) which I removed successfully. Await pathology results, my office will contact you.   Given your age, I do not think you need further colonoscopies for polyp surveillance.    Follow up with GI as needed.   I hope you have a great rest of your week!  Carlin POUR. Cindie, D.O. Gastroenterology and Hepatology [facility]

## 2024-10-24 NOTE — Op Note (Signed)
 [facility] Patient Name: Gabriella Holmes Procedure Date: 10/24/2024 9:05 AM MRN: [id number] Date of Birth: 05/28/1951 Attending MD: Carlin POUR. Cindie , [place], [id number] CSN: [id number] Age: 73 Admit Type: Outpatient Procedure:                Colonoscopy Indications:              Screening for colorectal malignant neoplasm,                            Incidental - Positive Cologuard test Providers:                Carlin POUR. Cindie, DO, [place], [place] Referring MD:              Medicines:                See the Anesthesia note for documentation of the                            administered medications Complications:            No immediate complications. Estimated Blood Loss:     Estimated blood loss was minimal. Procedure:                Pre-Anesthesia Assessment:                           - The anesthesia plan was to use monitored                            anesthesia care (MAC).                           After obtaining informed consent, the colonoscope                            was passed under direct vision. Throughout the                            procedure, the patient's blood pressure, pulse, and                            oxygen saturations were monitored continuously. The                            [id number]-HQ190L ([id number]) Peds Colon was introduced                            through the anus and advanced to the the cecum,                            identified by appendiceal orifice and ileocecal                            valve. The colonoscopy was performed without                            difficulty. The patient  tolerated the procedure                            well. The quality of the bowel preparation was                            evaluated using the BBPS [facility] Bowel Preparation                            Scale) with scores of: Right Colon = 3, Transverse                            Colon = 3 and Left Colon = 3 (entire mucosa seen                             well with no residual staining, small fragments of                            stool or opaque liquid). The total BBPS score                            equals 9. Scope In: 9:21:45 AM Scope Out: 9:37:57 AM Scope Withdrawal Time: 0 hours 11 minutes 16 seconds  Total Procedure Duration: 0 hours 16 minutes 12 seconds  Findings:      Non-bleeding internal hemorrhoids were found.      A 4 mm polyp was found in the transverse colon. The polyp was sessile.       The polyp was removed with a cold snare. Resection and retrieval were       complete.      The exam was otherwise without abnormality. Impression:               - Non-bleeding internal hemorrhoids.                           - One 4 mm polyp in the transverse colon, removed                            with a cold snare. Resected and retrieved.                           - The examination was otherwise normal. Moderate Sedation:      Per Anesthesia Care Recommendation:           - Patient has a contact number available for                            emergencies. The signs and symptoms of potential                            delayed complications were discussed with the                            patient. Return to normal activities tomorrow.  Written discharge instructions were provided to the                            patient.                           - Resume previous diet.                           - Continue present medications.                           - Await pathology results.                           - No repeat colonoscopy due to age.                           - Return to GI clinic PRN. Procedure Code(s):        --- Professional ---                           [phone], Colonoscopy, flexible; with removal of                            tumor(s), polyp(s), or other lesion(s) by snare                            technique Diagnosis Code(s):        --- Professional ---                           Z12.11,  Encounter for screening for malignant                            neoplasm of colon                           D12.3, Benign neoplasm of transverse colon (hepatic                            flexure or splenic flexure)                           K64.8, Other hemorrhoids CPT copyright 2022 [employer]. All rights reserved. The codes documented in this report are preliminary and upon coder review may  be revised to meet current compliance requirements. Carlin POUR. Cindie, DO Carlin POUR. Cindie, DO 10/24/2024 9:43:22 AM This report has been signed electronically. Number of Addenda: 0

## 2024-10-24 NOTE — Transfer of Care (Signed)
 Immediate Anesthesia Transfer of Care Note  Patient: Gabriella Holmes  Procedure(s) Performed: COLONOSCOPY POLYPECTOMY, INTESTINE  Patient Location: PACU  Anesthesia Type:MAC  Level of Consciousness: awake and drowsy  Airway & Oxygen Therapy: Patient Spontanous Breathing  Post-op Assessment: Report given to RN, Post -op Vital signs reviewed and stable, and Patient moving all extremities X 4  Post vital signs: Reviewed and stable  Last Vitals:  Vitals Value Taken Time  BP    Temp    Pulse    Resp    SpO2      Last Pain:  Vitals:   10/24/24 0916  TempSrc:   PainSc: 0-No pain      Patients Stated Pain Goal: 5 (10/24/24 0815)  Complications: No notable events documented.

## 2024-10-24 NOTE — Anesthesia Preprocedure Evaluation (Signed)
 Anesthesia Evaluation  Patient identified by MRN, date of birth, ID band Patient awake    Reviewed: Allergy & Precautions, H&P , NPO status , Patient's Chart, lab work & pertinent test results, reviewed documented beta blocker date and time   Airway Mallampati: II  TM Distance: >3 FB Neck ROM: full    Dental no notable dental hx.    Pulmonary neg pulmonary ROS, former smoker   Pulmonary exam normal breath sounds clear to auscultation       Cardiovascular Exercise Tolerance: Good hypertension,  Rhythm:regular Rate:Normal     Neuro/Psych  Neuromuscular disease  negative psych ROS   GI/Hepatic Neg liver ROS,GERD  ,,  Endo/Other  diabetes    Renal/GU negative Renal ROS  negative genitourinary   Musculoskeletal   Abdominal   Peds  Hematology negative hematology ROS (+)   Anesthesia Other Findings   Reproductive/Obstetrics negative OB ROS                              Anesthesia Physical Anesthesia Plan  ASA: 2  Anesthesia Plan: MAC   Post-op Pain Management:    Induction:   PONV Risk Score and Plan: Propofol  infusion  Airway Management Planned:   Additional Equipment:   Intra-op Plan:   Post-operative Plan:   Informed Consent: I have reviewed the patients History and Physical, chart, labs and discussed the procedure including the risks, benefits and alternatives for the proposed anesthesia with the patient or authorized representative who has indicated his/her understanding and acceptance.     Dental Advisory Given  Plan Discussed with: CRNA  Anesthesia Plan Comments:         Anesthesia Quick Evaluation

## 2024-10-25 LAB — SURGICAL PATHOLOGY

## 2024-10-26 ENCOUNTER — Encounter (HOSPITAL_COMMUNITY): Payer: Self-pay | Admitting: Internal Medicine

## 2024-10-27 NOTE — Anesthesia Postprocedure Evaluation (Signed)
"   Anesthesia Post Note  Patient: Gabriella Holmes  Procedure(s) Performed: COLONOSCOPY POLYPECTOMY, INTESTINE  Patient location during evaluation: Phase II Anesthesia Type: MAC Level of consciousness: awake Pain management: pain level controlled Vital Signs Assessment: post-procedure vital signs reviewed and stable Respiratory status: spontaneous breathing and respiratory function stable Cardiovascular status: blood pressure returned to baseline and stable Postop Assessment: no headache and no apparent nausea or vomiting Anesthetic complications: no Comments: Late entry   No notable events documented.   Last Vitals:  Vitals:   10/24/24 0815 10/24/24 0945  BP: (!) 151/73 (!) 116/43  Pulse: 94 94  Resp: 18 19  Temp: 36.7 C 36.5 C  SpO2: 100% 98%    Last Pain:  Vitals:   10/24/24 0945  TempSrc: Oral  PainSc: 0-No pain                 Yvonna JINNY Stefan Karen      "

## 2024-11-20 NOTE — Telephone Encounter (Signed)
 Pt stated that she had a mychart message about a recall procedure. Advised pt that was an old recall, that she has already had her procedure on 10/24/24, just disregard message that sent.
